# Patient Record
Sex: Male | Born: 1985 | Race: Black or African American | Hispanic: No | Marital: Single | Smoking: Never smoker
Health system: Southern US, Community
[De-identification: ages and names within clinical notes are randomized; demographics above are authoritative.]

## PROBLEM LIST (undated history)

## (undated) ENCOUNTER — Ambulatory Visit (HOSPITAL_COMMUNITY): Payer: Self-pay

## (undated) DIAGNOSIS — F329 Major depressive disorder, single episode, unspecified: Secondary | ICD-10-CM

## (undated) DIAGNOSIS — F32A Depression, unspecified: Secondary | ICD-10-CM

## (undated) DIAGNOSIS — M797 Fibromyalgia: Secondary | ICD-10-CM

## (undated) DIAGNOSIS — B20 Human immunodeficiency virus [HIV] disease: Secondary | ICD-10-CM

## (undated) DIAGNOSIS — G43909 Migraine, unspecified, not intractable, without status migrainosus: Secondary | ICD-10-CM

## (undated) DIAGNOSIS — F419 Anxiety disorder, unspecified: Secondary | ICD-10-CM

## (undated) SURGERY — INCISION AND DRAINAGE, ABSCESS
Anesthesia: General

---

## 2006-10-16 ENCOUNTER — Emergency Department (HOSPITAL_COMMUNITY): Admission: EM | Admit: 2006-10-16 | Discharge: 2006-10-16 | Payer: Self-pay | Admitting: Emergency Medicine

## 2009-01-05 ENCOUNTER — Emergency Department (HOSPITAL_COMMUNITY): Admission: EM | Admit: 2009-01-05 | Discharge: 2009-01-05 | Payer: Self-pay | Admitting: Family Medicine

## 2009-09-23 ENCOUNTER — Emergency Department (HOSPITAL_COMMUNITY): Admission: EM | Admit: 2009-09-23 | Discharge: 2009-09-23 | Payer: Self-pay | Admitting: Emergency Medicine

## 2010-01-30 ENCOUNTER — Encounter: Payer: Self-pay | Admitting: Infectious Disease

## 2010-02-12 ENCOUNTER — Encounter: Payer: Self-pay | Admitting: Adult Health

## 2010-02-12 ENCOUNTER — Ambulatory Visit: Payer: Self-pay | Admitting: Internal Medicine

## 2010-02-12 DIAGNOSIS — F341 Dysthymic disorder: Secondary | ICD-10-CM

## 2010-02-12 DIAGNOSIS — G43909 Migraine, unspecified, not intractable, without status migrainosus: Secondary | ICD-10-CM | POA: Insufficient documentation

## 2010-02-12 DIAGNOSIS — B20 Human immunodeficiency virus [HIV] disease: Secondary | ICD-10-CM | POA: Insufficient documentation

## 2010-02-12 LAB — CONVERTED CEMR LAB
ALT: 58 units/L — ABNORMAL HIGH (ref 0–53)
AST: 31 units/L (ref 0–37)
Albumin: 4.4 g/dL (ref 3.5–5.2)
Alkaline Phosphatase: 85 units/L (ref 39–117)
Basophils Absolute: 0 10*3/uL (ref 0.0–0.1)
Basophils Relative: 1 % (ref 0–1)
Bilirubin Urine: NEGATIVE
Calcium: 9.4 mg/dL (ref 8.4–10.5)
Chlamydia, Swab/Urine, PCR: NEGATIVE
Chloride: 103 meq/L (ref 96–112)
Eosinophils Absolute: 0 10*3/uL (ref 0.0–0.7)
GC Probe Amp, Urine: NEGATIVE
HIV 1 RNA Quant: 168000 copies/mL — ABNORMAL HIGH (ref <20)
HIV-1 RNA Quant, Log: 5.23 — ABNORMAL HIGH (ref <1.30)
HIV-1 antibody: POSITIVE — AB
HIV-2 Ab: NEGATIVE
HIV: REACTIVE
Hemoglobin, Urine: NEGATIVE
Hep B S Ab: NEGATIVE
Ketones, ur: NEGATIVE mg/dL
MCHC: 34.3 g/dL (ref 30.0–36.0)
MCV: 89.6 fL (ref 78.0–100.0)
Monocytes Relative: 16 % — ABNORMAL HIGH (ref 3–12)
Neutro Abs: 1.2 10*3/uL — ABNORMAL LOW (ref 1.7–7.7)
Neutrophils Relative %: 28 % — ABNORMAL LOW (ref 43–77)
Nitrite: NEGATIVE
Platelets: 306 10*3/uL (ref 150–400)
Potassium: 4.2 meq/L (ref 3.5–5.3)
RBC: 5.3 M/uL (ref 4.22–5.81)
RDW: 13 % (ref 11.5–15.5)
Sodium: 138 meq/L (ref 135–145)
Specific Gravity, Urine: 1.02 (ref 1.005–1.030)
Urobilinogen, UA: 0.2 (ref 0.0–1.0)
pH: 6.5 (ref 5.0–8.0)

## 2010-02-13 ENCOUNTER — Encounter: Payer: Self-pay | Admitting: Internal Medicine

## 2010-02-28 ENCOUNTER — Telehealth: Payer: Self-pay | Admitting: Infectious Diseases

## 2010-03-04 ENCOUNTER — Ambulatory Visit: Payer: Self-pay | Admitting: Internal Medicine

## 2010-03-11 ENCOUNTER — Encounter: Payer: Self-pay | Admitting: Infectious Disease

## 2010-03-20 ENCOUNTER — Encounter: Payer: Self-pay | Admitting: Infectious Disease

## 2010-03-26 ENCOUNTER — Telehealth (INDEPENDENT_AMBULATORY_CARE_PROVIDER_SITE_OTHER): Payer: Self-pay | Admitting: *Deleted

## 2010-03-27 ENCOUNTER — Telehealth: Payer: Self-pay | Admitting: Internal Medicine

## 2010-03-27 ENCOUNTER — Encounter (INDEPENDENT_AMBULATORY_CARE_PROVIDER_SITE_OTHER): Payer: Self-pay | Admitting: *Deleted

## 2010-03-28 ENCOUNTER — Encounter (INDEPENDENT_AMBULATORY_CARE_PROVIDER_SITE_OTHER): Payer: Self-pay | Admitting: *Deleted

## 2010-04-16 ENCOUNTER — Ambulatory Visit: Payer: Self-pay | Admitting: Adult Health

## 2010-04-25 ENCOUNTER — Telehealth: Payer: Self-pay | Admitting: Adult Health

## 2010-04-29 ENCOUNTER — Ambulatory Visit: Admit: 2010-04-29 | Payer: Self-pay | Admitting: Adult Health

## 2010-04-30 ENCOUNTER — Encounter (INDEPENDENT_AMBULATORY_CARE_PROVIDER_SITE_OTHER): Payer: Self-pay | Admitting: *Deleted

## 2010-05-20 ENCOUNTER — Ambulatory Visit
Admission: RE | Admit: 2010-05-20 | Discharge: 2010-05-20 | Payer: Self-pay | Source: Home / Self Care | Attending: Adult Health | Admitting: Adult Health

## 2010-05-20 ENCOUNTER — Encounter: Payer: Self-pay | Admitting: Adult Health

## 2010-05-20 DIAGNOSIS — K006 Disturbances in tooth eruption: Secondary | ICD-10-CM | POA: Insufficient documentation

## 2010-05-20 LAB — CONVERTED CEMR LAB
ALT: 43 units/L (ref 0–53)
AST: 20 units/L (ref 0–37)
Basophils Absolute: 0 10*3/uL (ref 0.0–0.1)
Basophils Relative: 0 % (ref 0–1)
Creatinine, Ser: 1.09 mg/dL (ref 0.40–1.50)
Eosinophils Absolute: 0 10*3/uL (ref 0.0–0.7)
Eosinophils Relative: 0 % (ref 0–5)
HCT: 50.4 % (ref 39.0–52.0)
HIV 1 RNA Quant: 87 copies/mL — ABNORMAL HIGH (ref <20)
MCHC: 33.9 g/dL (ref 30.0–36.0)
MCV: 91.8 fL (ref 78.0–100.0)
Neutrophils Relative %: 27 % — ABNORMAL LOW (ref 43–77)
Platelets: 307 10*3/uL (ref 150–400)
RDW: 13.4 % (ref 11.5–15.5)
Sodium: 140 meq/L (ref 135–145)
Total Bilirubin: 0.4 mg/dL (ref 0.3–1.2)
Total Protein: 8.2 g/dL (ref 6.0–8.3)

## 2010-05-21 ENCOUNTER — Other Ambulatory Visit: Payer: Self-pay | Admitting: Infectious Diseases

## 2010-05-21 LAB — T-HELPER CELL (CD4) - (RCID CLINIC ONLY)
CD4 % Helper T Cell: 18 % — ABNORMAL LOW (ref 33–55)
CD4 T Cell Abs: 480 uL (ref 400–2700)

## 2010-05-22 NOTE — Miscellaneous (Signed)
Summary: RW Financial Update  Clinical Lists Changes  Observations: Added new observation of RWPARTICIP: Yes (03/27/2010 9:14) Added new observation of PAYOR: No Insurance (03/27/2010 9:14) Added new observation of RWTITLE: B (03/27/2010 9:14) Added new observation of AIDSDAP: applying (03/27/2010 9:14) Added new observation of PCTFPL: 119.08  (03/27/2010 9:14) Added new observation of INCOMESOURCE: unemployment ins  (03/27/2010 9:14) Added new observation of HOUSEINCOME: [address]  (03/27/2010 9:14) Added new observation of #CHILD<18 IN: No  (03/27/2010 9:14) Added new observation of FAMILYSIZE: 1  (03/27/2010 9:14) Added new observation of HOUSING: Stable/permanent  (03/27/2010 9:14) Added new observation of FINASSESSDT: 03/26/2010  (03/27/2010 9:14) Added new observation of YEARLYEXPEN: 0  (03/27/2010 9:14)  Appended Document: RW Financial Update Patient applied for ADAP - faxed app on 03/26/10

## 2010-05-22 NOTE — Miscellaneous (Signed)
Summary: ADAP Update  Clinical Lists Changes  Observations: Added new observation of AIDSDAP: Yes 2011 (03/28/2010 11:44)

## 2010-05-22 NOTE — Progress Notes (Signed)
Summary: health dept. calling  Phone Note Other Incoming   Caller: [facility] health Dept. Summary of Call: Shon Hough from [facility]. called requesting that pt. have RPR checked at appt. 03/04/10.  Pts. last RPR done during the time pt. listed as contact  Initial call taken by: Wendall Mola CMA Duncan Dull),  February 28, 2010 2:43 PM

## 2010-05-22 NOTE — Miscellaneous (Signed)
Summary: HIPAA Restrictions  HIPAA Restrictions   Imported By: Florinda Marker 02/14/2010 14:14:01  _____________________________________________________________________  External Attachment:    Type:   Image     Comment:   External Document

## 2010-05-22 NOTE — Progress Notes (Signed)
Summary: Atripla sample given  Phone Note Call from Patient   Caller: Patient Summary of Call: Pt. came in for ADAP certification, per Britta Mccreedy it should take no more than 2 weeks.  Per Dr. Philipp Deputy pt. given samples of Atripla take one tablet one time a day #30.  Lot # [id number], Exp. 11/08/11.  Pt. advised of instructions and side effects. Initial call taken by: Wendall Mola CMA Duncan Dull),  March 26, 2010 2:36 PM

## 2010-05-22 NOTE — Consult Note (Signed)
Summary: New Pt. Referral:  New Pt. Referral:   Imported By: Florinda Marker 03/20/2010 11:16:05  _____________________________________________________________________  External Attachment:    Type:   Image     Comment:   External Document

## 2010-05-22 NOTE — Assessment & Plan Note (Signed)
Summary: Nurse Visit (Infectious Disease)   Infectious Disease New Patient Intake Referring MD/Agency: [facility] Address: [address] [place], [place] [address]  Employer: [employer]   Does insurance cover prescriptions? No Our patient has been informed that medication assistance programs are available.  Our Co-ordinator will be meeting with the patient during this visit to discuss financial and medication assistance.   Do you have a Primary physician: Yes Are family members aware of patient's diagnosis?  If so, are they supportive? None Describe patient's current social support (family, friends, support groups): Friends  Medical History Medication Allergies: No   Medical Hx Comments: Pt is currently out on Medical leave due to increase stress/anxiety from his employer.  He is under the care of a Psychiatrist  Dr Dionne Milo .   Recent complaints of bilateral knee pain X 2 months .   Family History Hypertension: Yes Cancer: Yes  Family Side: Maternal, Paternal  Comments: colon  Tobacco Use: never  Behavioral Health Assessment Have you ever been diagnosed with depression or mental illness? Yes  Diagnosis: Deprsssion/Anxiety due to on the job stress Do you drink alcohol? Yes Frequency: social Alcohol Beverage Type(s): wine Do you use recreational drugs? No Do you feel you have a problem with drugs and/or alcohol? No   If you are currently using drugs and/or alcohol, would you like help for this addiction? No  HIV Intake Information When did you first test positive for HIV? 12/13/2009 Type of test Conducted: Western Blot   Where was this test performed?  Name of Agency: [facility]  Risk Factor(s) for HIV: MSM  Method of Exposure to HIV: Homosexual Intercourse-Receptive Have you ever been hospitalized for any HIV-related condition? No  Have you ever been under the care of a physician for being HIV positive? No  Newly Diagnosed Patients Has a Disease Intervention Specialist  from the Health Department contacted the patient? Yes.   The patient has been informed that the [facility] Department will contact ALL newly reported cases. Health Department Contact:  [phone]   (SSN is needed for confirmation)  Reported Today: 02/20/2010 Health Department Contact:  [phone]            (SSN is needed for confirmation)  Person Reporting: Prev. reproted /GHD Do you have any Non-HIV related medical conditions or other prior hospitalizations or surgeries? No  HIV Medications Information The patient is currently NOT taking any HIV medications.  Infection History  Patient has been diagnosed with the following opportunistic infections: Are there any other symptoms you need to discuss? No Have you received literature/education prior to this visit about HIV/AIDS? No Do you understand the meaning of a Viral Load? No Do you understand the meaning of a CD4 count? No Lab Values Education/Handout Given Yes Medication Education/Handout Given Yes  Sexual History Are you in a current relationship? No If no, when was your last encounter? 8/11 Was this protected intercourse? No When was your last unprotected sex? 8/11 Safe Sex Counseling/Pamphlet Given Sexual History Comments: Last 1 months 4 male partners (unprotected)  Last year 69 male partners(unprotected)  Lifetime 29 male partners(unprotected) to include 2 females   Evaluation and Follow-Up INTAKE CHECK LIST: HIV Education, Safe Sex Counseling, HIV Material Given  Prevention For Positives: 02/12/2010   Safe sex practices discussed with patient. Condoms offered. Are you in need of condoms at this time? No Our patient has been informed that condoms are always available in this clinic.   Brochure Provided for Above Organizations? Yes  Name of Agency: [facility] Referral made to Social Worker, Dorothe Pea, LCSW-P: No  Immunizations Administered:  Influenza Vaccine # 1:    Vaccine Type: Fluvax Non-MCR    Site: right  deltoid    Mfr: fluvirin    Dose: 0.5 ml    Route: IM    Given by: Tomasita Morrow RN    Exp. Date: 08/17/2010    Lot #: [id number]    VIS given: 11/13/09 version given February 12, 2010.  PPD Skin Test:    Vaccine Type: PPD    Site: left forearm    Mfr: Sanofi Pasteur    Dose: 0.1 ml    Route: ID    Given by: Tomasita Morrow RN    Exp. Date: 10/26/2011    Lot #: [id number]  Pneumonia Vaccine:    Vaccine Type: Pneumovax    Site: left deltoid    Mfr: Merck    Dose: 0.5 ml    Route: IM    Given by: Tomasita Morrow RN    Exp. Date: 08/01/2011    Lot #: [id number]    VIS given: 03/26/09 version given February 12, 2010.  Flu Vaccine Consent Questions:    Do you have a history of severe allergic reactions to this vaccine? no    Any prior history of allergic reactions to egg and/or gelatin? no    Do you have a sensitivity to the preservative Thimersol? no    Do you have a past history of Guillan-Barre Syndrome? no    Do you currently have an acute febrile illness? no    Have you ever had a severe reaction to latex? no    Vaccine information given and explained to patient? yes             Prevention For Positives: 02/12/2010   Safe sex practices discussed with patient. Condoms offered.

## 2010-05-22 NOTE — Consult Note (Signed)
Summary: New Pt. Referral: [facility]  New Pt. Referral: [facility]   Imported By: Florinda Marker 03/06/2010 16:12:19  _____________________________________________________________________  External Attachment:    Type:   Image     Comment:   External Document
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
## 2010-05-22 NOTE — Miscellaneous (Signed)
Summary: [employer] Disability: Appeals Specialist  [employer] Disability: Appeals Specialist   Imported By: Florinda Marker 03/20/2010 11:17:19  _____________________________________________________________________  External Attachment:    Type:   Image     Comment:   External Document

## 2010-05-22 NOTE — Progress Notes (Signed)
Summary: [employer] Physician  Phone Note Other Incoming   Caller: Dr. Warren Danes from [facility] Summary of Call: Dr. Warren Danes from [facility] called regarding a disability claim for pt.  Pt. has signed release of info.  I spoke with Dr. Warren Danes and per Dr. Philipp Deputy pt. is not disabled from an HIV  standpoint, he does have migraines, but until he receives discount card we are unable to order MRI or refer to Neurologist.  Dr. Warren Danes also spoke to PCP Dr. Derrek Gu and was told he was not disabled from a medical standpoint.  Possibly mental health and I let him know he has an appt. with therapist Alysa for next week. Initial call taken by: Wendall Mola CMA Duncan Dull),  March 27, 2010 4:31 PM

## 2010-05-23 NOTE — Progress Notes (Signed)
Summary: pt. requesting paperwork   Phone Note Call from Patient Call back at Home Phone [phone]   Caller: Patient Call For: Starleen Arms, CMA Reason for Call: Talk to Nurse Summary of Call: Pt. lrft message re: paperwork that he had requested.  Would appreciate a call back.  Jennet Maduro RN  April 25, 2010 11:25 AM

## 2010-05-23 NOTE — Assessment & Plan Note (Signed)
Summary: per tammy meds? [mkj]    Primary Samiyyah Moffa:  Talmadge Chad NP  CC:  haveing side effects from Atripla, diziness, vivid dreams, and "feeling loopy".  History of Present Illness: Presents to clinic with c/o intolerance to Atripla with severe vivid dreams and night terrors.  Claims symptoms were unrelenting so voluntarily stopped regimen.  Has not had symtoms since stopping regimen.  Hx of migraine headaches and depression.  No episodes of headaches or anxiety currently.  Has not taken meds for depression for "some time" secondary to side effects of Seroquel.  States meds made him "sleepy" and lack energy.  Still has some depressive episodes, but denies SI or anxiety.  Missed appointment with mental health counselor and has not made another appointment.   Preventive Screening-Counseling & Management  Alcohol-Tobacco     Alcohol drinks/day: social     Smoking Status: never   Current Allergies (reviewed today): ! * SEAFOOD Review of Systems  The patient denies anorexia, fever, weight loss, weight gain, vision loss, decreased hearing, hoarseness, chest pain, syncope, dyspnea on exertion, peripheral edema, prolonged cough, headaches, hemoptysis, abdominal pain, melena, hematochezia, severe indigestion/heartburn, hematuria, incontinence, genital sores, muscle weakness, suspicious skin lesions, transient blindness, difficulty walking, depression, unusual weight change, abnormal bleeding, enlarged lymph nodes, angioedema, breast masses, and testicular masses.    Vital Signs:  Patient profile:   25 year old male Height:      65 inches (165.10 cm) Weight:      164.50 pounds (74.77 kg) BMI:     26.39 Temp:     97.9 degrees F (36.61 degrees C) oral Pulse rate:   69 / minute BP sitting:   130 / 84  (left arm)  Vitals Entered By: Starleen Arms CMA (April 16, 2010 11:07 AM) CC: haveing side effects from Atripla, diziness, vivid dreams, "feeling loopy" Is Patient Diabetic?  No Pain Assessment Patient in pain? no      Nutritional Status BMI of 25 - 29 = overweight  Does patient need assistance? Functional Status Self care Ambulation Normal   Physical Exam  General:  alert, well-developed, well-nourished, and well-hydrated.   Head:  Normocephalic and atraumatic without obvious abnormalities. No apparent alopecia or balding. Eyes:  No corneal or conjunctival inflammation noted. EOMI. Perrla. Funduscopic exam benign, without hemorrhages, exudates or papilledema. Vision grossly normal. Ears:  External ear exam shows no significant lesions or deformities.  Otoscopic examination reveals clear canals, tympanic membranes are intact bilaterally without bulging, retraction, inflammation or discharge. Hearing is grossly normal bilaterally. Nose:  External nasal examination shows no deformity or inflammation. Nasal mucosa are pink and moist without lesions or exudates. Mouth:  Oral mucosa and oropharynx without lesions or exudates.  Teeth in good repair. Neck:  No deformities, masses, or tenderness noted. Chest Wall:  No deformities, masses, tenderness or gynecomastia noted. Lungs:  Normal respiratory effort, chest expands symmetrically. Lungs are clear to auscultation, no crackles or wheezes. Heart:  Normal rate and regular rhythm. S1 and S2 normal without gallop, murmur, click, rub or other extra sounds. Abdomen:  Bowel sounds positive,abdomen soft and non-tender without masses, organomegaly or hernias noted. Rectal:  Deferred this visit Genitalia:  Deferred this visit Prostate:  Deferred Msk:  No deformity or scoliosis noted of thoracic or lumbar spine.   Pulses:  R and L carotid,radial,femoral,dorsalis pedis and posterior tibial pulses are full and equal bilaterally Extremities:  No clubbing, cyanosis, edema, or deformity noted with normal full range of motion of all joints.  Neurologic:  No cranial nerve deficits noted. Station and gait are normal. Sensory,  motor and coordinative functions appear intact. Skin:  Intact without suspicious lesions or rashes Cervical Nodes:  Shoddy A&P LAD Axillary Nodes:  Shoddy bil LAD Psych:  Oriented X3, memory intact for recent and remote, good eye contact, not anxious appearing, not agitated, flat affect, and subdued.     Impression & Recommendations:  Problem # 1:  HIV INFECTION (ICD-042) Intolerance to effects of Atripla.  Discussed in detail alternate ARV options including SE's, ADR's, and potential toxicities.  After extensive discussion (> 20 min), we chose Truvada and Isentress as his new regimen.  Discussed pathology of HIV, goals of treatment, and benefits as well as risks of treatment, importance of adherence.  Asked appropriate questions, acknowledged information provided and agreed with plan of care.  Will begin regimen after April 22, 2010, with scheduled lab draw in 4 weeks from start date, and f/u in 6 weeks.  Instructed to contact clinic if having any problems with new regimen.  Instructed to bring questions he may have regarding mediucations and his care on his next f/u.  Hep B #1 and Hep A #1 to be given. Orders: Est. Patient Level IV (99214)Future Orders: T-CBC w/Diff ([id number]) ... 05/20/2010 T-CD4SP ([facility]) (CD4SP) ... 05/20/2010 T-Comprehensive Metabolic Panel [id number]) ... 05/20/2010 T-HIV Viral Load [id number]) ... 05/20/2010  Problem # 2:  ANXIETY DEPRESSION (ICD-300.4) Instructed to schedule and keep appointment with mental health counselor.  Once she has evaluated current state, we will discuss possible treatment options for him.  He acknowledged this and agreed with plan of care.  Medications Added to Medication List This Visit: 1)  Isentress 400 Mg Tabs (Raltegravir potassium) .... Take one (1) tablet every twelve (12) hours 2)  Truvada 200-300 Mg Tabs (Emtricitabine-tenofovir) .... Take one (1) tablet once a day  Other Orders: Hepatitis A Vaccine (Adult Dose)  (41324) Admin 1st Vaccine (40102) Hepatitis B Vaccine >43yrs (72536) Admin of Any Addtl Vaccine (64403)   Immunizations Administered:  Hepatitis A Vaccine # 1:    Vaccine Type: HepA    Site: left deltoid    Mfr: GlaxoSmithKline    Dose: 1.0 ml    Route: IM    Given by: Starleen Arms CMA    Exp. Date: 05/24/2012    Lot #: [id number]    VIS given: 07/09/04 version given April 16, 2010.  Hepatitis B Vaccine # 1:    Vaccine Type: HepB Adult    Site: left deltoid    Dose: 1.0 ml    Route: IM    Given by: Starleen Arms CMA    Exp. Date: 04/08/2012    Lot #: [id number]    VIS given: 11/05/05 version given April 16, 2010.

## 2010-05-23 NOTE — Miscellaneous (Signed)
  Clinical Lists Changes 

## 2010-05-24 ENCOUNTER — Encounter (HOSPITAL_COMMUNITY): Payer: Self-pay

## 2010-05-24 ENCOUNTER — Ambulatory Visit (HOSPITAL_COMMUNITY)
Admission: RE | Admit: 2010-05-24 | Discharge: 2010-05-24 | Disposition: A | Discharge status: Home / Self Care | Payer: Self-pay | Source: Ambulatory Visit | Attending: Infectious Diseases | Admitting: Infectious Diseases

## 2010-05-24 DIAGNOSIS — R5381 Other malaise: Secondary | ICD-10-CM | POA: Insufficient documentation

## 2010-05-24 DIAGNOSIS — R112 Nausea with vomiting, unspecified: Secondary | ICD-10-CM | POA: Insufficient documentation

## 2010-05-24 DIAGNOSIS — R51 Headache: Secondary | ICD-10-CM | POA: Insufficient documentation

## 2010-05-24 MED ORDER — IOHEXOL 300 MG/ML  SOLN: 100.0000 mL | Freq: Once | INTRAMUSCULAR | Status: AC | PRN

## 2010-05-28 ENCOUNTER — Telehealth: Payer: Self-pay | Admitting: Adult Health

## 2010-05-30 ENCOUNTER — Ambulatory Visit: Payer: Self-pay | Admitting: Adult Health

## 2010-05-30 ENCOUNTER — Telehealth (INDEPENDENT_AMBULATORY_CARE_PROVIDER_SITE_OTHER): Payer: Self-pay | Admitting: *Deleted

## 2010-05-31 ENCOUNTER — Encounter (INDEPENDENT_AMBULATORY_CARE_PROVIDER_SITE_OTHER): Payer: Self-pay | Admitting: *Deleted

## 2010-06-03 ENCOUNTER — Ambulatory Visit: Payer: Self-pay | Admitting: Adult Health

## 2010-06-05 ENCOUNTER — Encounter (INDEPENDENT_AMBULATORY_CARE_PROVIDER_SITE_OTHER): Payer: Self-pay | Admitting: *Deleted

## 2010-06-06 NOTE — Progress Notes (Signed)
Summary: CT results  Phone Note Call from Patient   Caller: Patient Summary of Call: Pt. called for CT results, pt. notified of normal CT Initial call taken by: Wendall Mola CMA Duncan Dull),  May 28, 2010 12:17 PM

## 2010-06-06 NOTE — Miscellaneous (Signed)
  Clinical Lists Changes 

## 2010-06-06 NOTE — Assessment & Plan Note (Signed)
Summary: TOOTH PAIN/VS   Primary Provider:  Talmadge Chad NP  CC:  pt in with toothache, pt needs to talk abt his disability information, and Depression.  History of Present Illness: In clinic today for labs, but c/o impacted wisdom tooth pain.  Scheduled to be evaluated by dentist, but pain is "distracting."  Also endorses a long-standing hx of "crippling" migraine headaches that keep him "in bed" for full days.  Denies any precipitous aura or focal point of headache.  Relates only a diffuse "pressure-like" sensation.  Denies any neurologic w/u except for head CT in 2011.  Also chronic hx of depression which he says has kept him from working (in addition to "migraines").  Has not a recent psychiatric evaluation but claims any medication he takes for depression has caused intolerance.  States he still feels depressed, but denies suicidal/homicidal ideations.  Also states HIV medications causing him vivid dreams and anxiety.  He has been off Atripla >1 month.  Given all these symptoms, he is requesting we determine him disabled.  The patient denies that he feels like life is not worth living, denies that he wishes that he were dead, and denies that he has thought about ending his life.        Comments:  pt says he is aggrevated but fine.   Preventive Screening-Counseling & Management  Alcohol-Tobacco     Alcohol drinks/day: social     Smoking Status: never  Caffeine-Diet-Exercise     Caffeine use/day: yes     Does Patient Exercise: no  Hep-HIV-STD-Contraception     HIV Risk: no risk noted     HIV Risk Counseling: not indicated-no HIV risk noted  Safety-Violence-Falls     Seat Belt Use: yes      Sexual History:  almost in a relationship.        Drug Use:  never.    Comments: pt accepted condoms   Current Allergies: ! * SEAFOOD Review of Systems General:  Complains of loss of appetite, malaise, and sleep disorder; denies chills, fatigue, fever, sweats, weakness, and weight  loss. Eyes:  Complains of light sensitivity; denies blurring, discharge, double vision, eye irritation, eye pain, halos, itching, red eye, vision loss-1 eye, and vision loss-both eyes. ENT:  Denies decreased hearing, difficulty swallowing, ear discharge, earache, hoarseness, nasal congestion, nosebleeds, postnasal drainage, ringing in ears, sinus pressure, and sore throat. CV:  Denies bluish discoloration of lips or nails, chest pain or discomfort, difficulty breathing at night, difficulty breathing while lying down, fainting, fatigue, leg cramps with exertion, lightheadness, near fainting, palpitations, shortness of breath with exertion, swelling of feet, swelling of hands, and weight gain. Resp:  Denies chest discomfort, chest pain with inspiration, cough, coughing up blood, excessive snoring, hypersomnolence, morning headaches, pleuritic, shortness of breath, sputum productive, and wheezing. GI:  Complains of loss of appetite and nausea; denies abdominal pain, bloody stools, change in bowel habits, constipation, dark tarry stools, diarrhea, excessive appetite, gas, hemorrhoids, indigestion, vomiting, vomiting blood, and yellowish skin color. MS:  Denies joint pain, joint redness, joint swelling, loss of strength, low back pain, mid back pain, muscle aches, muscle, cramps, muscle weakness, stiffness, and thoracic pain. Derm:  Denies changes in color of skin, changes in nail beds, dryness, excessive perspiration, flushing, hair loss, insect bite(s), itching, lesion(s), poor wound healing, and rash. Neuro:  Complains of difficulty with concentration, disturbances in coordination, falling down, and headaches; denies brief paralysis, inability to speak, memory loss, numbness, poor balance, seizures, sensation of room spinning,  tingling, tremors, visual disturbances, and weakness. Psych:  Complains of anxiety, depression, easily angered, easily tearful, irritability, and mental problems; denies panic attacks,  sense of great danger, suicidal thoughts/plans, thoughts of violence, unusual visions or sounds, and thoughts /plans of harming others. Endo:  Denies cold intolerance, excessive hunger, excessive thirst, excessive urination, heat intolerance, polyuria, and weight change. Heme:  Denies abnormal bruising, bleeding, enlarge lymph nodes, fevers, pallor, and skin discoloration.  Vital Signs:  Patient profile:   25 year old male Height:      65 inches Weight:      158 pounds BMI:     26.39 Temp:     98 degrees F oral Pulse rate:   73 / minute BP sitting:   133 / 85  (right arm)  Vitals Entered By: Alesia Morin CMA (May 20, 2010 2:27 PM) CC: pt in with toothache, pt needs to talk abt his disability information, Depression Is Patient Diabetic? No Pain Assessment Patient in pain? no      Nutritional Status BMI of 25 - 29 = overweight Nutritional Status Detail appetite "not to good"  Have you ever been in a relationship where you felt threatened, hurt or afraid?No   Does patient need assistance? Functional Status Self care Ambulation Normal Comments no missed doses   Physical Exam  General:  alert, well-developed, well-nourished, and well-hydrated.   Head:  normocephalic, atraumatic, no abnormalities observed, and no abnormalities palpated.   Eyes:  vision grossly intact, pupils equal, pupils round, and pupils reactive to light.   Ears:  External ear exam shows no significant lesions or deformities.  Otoscopic examination reveals clear canals, tympanic membranes are intact bilaterally without bulging, retraction, inflammation or discharge. Hearing is grossly normal bilaterally. Mouth:  (+) impacted molars in (L) mandibular area. Neck:  supple and full ROM.   Lungs:  normal respiratory effort and normal breath sounds.   Heart:  normal rate and regular rhythm.   Abdomen:  soft, non-tender, normal bowel sounds, no hepatomegaly, and no splenomegaly.   Msk:  normal ROM, no joint  tenderness, no joint swelling, no joint warmth, and no redness over joints.   Pulses:  Peripheral pulses 2+ symmetrically equal bilaterally. Extremities:  No clubbing, cyanosis, edema, or deformity noted with normal full range of motion of all joints.   Neurologic:  alert & oriented X3, cranial nerves II-XII intact, strength normal in all extremities, gait normal, finger-to-nose normal, heel-to-shin normal, toes down bilaterally on Babinski, and Romberg negative.   Skin:  Intact without suspicious lesions or rashes Psych:  Oriented X3, memory intact for recent and remote, normally interactive, good eye contact, not anxious appearing, not depressed appearing, and not agitated.     Impression & Recommendations:  Problem # 1:  HIV INFECTION (ICD-042) Labs today with f/u in 2 weeks.  I explained to him his current regimen does not have the intolerance issues he raises.  Recommend no TV or news prior to bed.  Encouraged to watch something light, humorous before bed and avoid ETOH or recreational drugs as these may impact sleep pattern and dream activity.  Problem # 2:  WISDOM TEETH IMPACTION (ICD-520.6) Will pend dental services referral.  Meanwhile, will give one-time course of Vicodin 1 by mouth q6h as needed pain (30 dispensed) Orders: Dental Referral (Dentist)  Problem # 3:  MIGRAINE HEADACHE (ICD-346.90) Needs head CT before this is further evaluated.  Given his limited funding source, we will need to secure some source of payment.  He  was referred to [facility] for case management to assist with this process.   Also needs referral to neurologist for migraines if head CT shows no abnormality. His updated medication list for this problem includes:    Hydrocodone-acetaminophen 5-500 Mg Tabs (Hydrocodone-acetaminophen) .Marland Kitchen... 1 tablet by mouth every 6 hours as needed for pain.  Orders: CT with Contrast (CT w/ contrast)  Problem # 4:  ANXIETY DEPRESSION (ICD-300.4) If he has a long hx of treatment  intolerance, psychiatry referral is warranted to ascertain what, if any, medications might assist him.  Additionally, he was informed given his complaints, it would be both a psychiatrist and neurologist who would need to ascertain his disability status as from a HIV perspective, he is not disabled. Orders: Psychiatric Referral (Psych)  Medications Added to Medication List This Visit: 1)  Hydrocodone-acetaminophen 5-500 Mg Tabs (Hydrocodone-acetaminophen) .Marland Kitchen.. 1 tablet by mouth every 6 hours as needed for pain. Prescriptions: HYDROCODONE-ACETAMINOPHEN 5-500 MG TABS (HYDROCODONE-ACETAMINOPHEN) 1 tablet by mouth every 6 hours as needed for pain.  #30 x 0   Entered and Authorized by:   Talmadge Chad NP   Signed by:   Talmadge Chad NP on 05/20/2010   Method used:   Print then Give to Patient   RxID:   [id number]

## 2010-06-06 NOTE — Progress Notes (Signed)
  Phone Note Outgoing Call   Call placed by: Alesia Morin CMA,  May 30, 2010 2:18 PM Call placed to: Patient Summary of Call: Informed patient that he would have to call [facility] to schedule his appointment. Gave him the number [phone] and encouraged him to make appt asap.

## 2010-06-08 ENCOUNTER — Encounter (INDEPENDENT_AMBULATORY_CARE_PROVIDER_SITE_OTHER): Payer: Self-pay | Admitting: *Deleted

## 2010-06-12 NOTE — Miscellaneous (Signed)
Summary: Rw update  Clinical Lists Changes

## 2010-06-12 NOTE — Miscellaneous (Signed)
  Clinical Lists Changes 

## 2010-07-05 ENCOUNTER — Ambulatory Visit: Payer: Self-pay | Admitting: Adult Health

## 2010-07-05 ENCOUNTER — Encounter: Payer: Self-pay | Admitting: Adult Health

## 2010-07-05 ENCOUNTER — Encounter (INDEPENDENT_AMBULATORY_CARE_PROVIDER_SITE_OTHER): Payer: Self-pay | Admitting: *Deleted

## 2010-07-08 LAB — COMPREHENSIVE METABOLIC PANEL
Albumin: 3.5 g/dL (ref 3.5–5.2)
BUN: 10 mg/dL (ref 6–23)
Calcium: 8.1 mg/dL — ABNORMAL LOW (ref 8.4–10.5)
Chloride: 104 mEq/L (ref 96–112)
Creatinine, Ser: 1.35 mg/dL (ref 0.4–1.5)
GFR calc non Af Amer: >60 mL/min (ref >60)
Total Bilirubin: 0.7 mg/dL (ref 0.3–1.2)

## 2010-07-08 LAB — CBC
HCT: 46.1 % (ref 39.0–52.0)
MCHC: 33.9 g/dL (ref 30.0–36.0)
MCV: 92.6 fL (ref 78.0–100.0)
Platelets: 150 10*3/uL (ref 150–400)
RDW: 13.2 % (ref 11.5–15.5)
WBC: 5.1 10*3/uL (ref 4.0–10.5)

## 2010-07-08 LAB — DIFFERENTIAL
Basophils Absolute: 0 10*3/uL (ref 0.0–0.1)
Lymphocytes Relative: 22 % (ref 12–46)
Monocytes Absolute: 0.5 10*3/uL (ref 0.1–1.0)
Neutro Abs: 3.5 10*3/uL (ref 1.7–7.7)

## 2010-07-09 NOTE — Miscellaneous (Signed)
Summary: Prescription Assistance  Clinical Lists Changes  Mr. Eduardo Leon completed the application for [employer] for Cymbalta and it was faxed today.  The company said that it can take up to 4 weeks to process.

## 2010-07-18 NOTE — Assessment & Plan Note (Signed)
Summary: f/u ov/vs   Vital Signs:  Patient profile:   25 year old male Height:      65 inches (165.10 cm) Weight:      156.50 pounds (71.14 kg) BMI:     26.14 Temp:     97.6 degrees F (36.44 degrees C) oral Pulse rate:   64 / minute BP sitting:   123 / 75  (left arm)  Vitals Entered By: Starleen Arms CMA (July 05, 2010 10:21 AM) CC: f/u Is Patient Diabetic? No Pain Assessment Patient in pain? yes     Location: all over Intensity: 3 Type: aching Nutritional Status BMI of 25 - 29 = overweight  Does patient need assistance? Functional Status Self care Ambulation Normal   CC:  f/u.  Allergies: 1)  ! * Seafood   Medications Added to Medication List This Visit: 1)  Cymbalta 20 Mg Cpep (Duloxetine hcl) .... Week 1:  1 tablet by mouth once daily x 7 days then  week 2: 1 1/2 tablets by mouth once daily x 7 days 2)  Cymbalta 20 Mg Cpep (Duloxetine hcl) .... Starting week 3: 1 tablet by mouth two times a day. Prescriptions: CYMBALTA 20 MG CPEP (DULOXETINE HCL) WEEK 1:  1 tablet by mouth once daily x 7 days then  WEEK 2: 1 1/2 tablets by mouth once daily x 7 days  #18 x 0   Entered and Authorized by:   Talmadge Chad NP   Signed by:   Talmadge Chad NP on 07/05/2010   Method used:   Print then Give to Patient   RxID:   [id number] CYMBALTA 20 MG CPEP (DULOXETINE HCL) STARTING WEEK 3: 1 tablet by mouth two times a day.  #14 x 0   Entered and Authorized by:   Talmadge Chad NP   Signed by:   Talmadge Chad NP on 07/05/2010   Method used:   Print then Give to Patient   RxID:   [id number]

## 2010-07-19 ENCOUNTER — Ambulatory Visit (INDEPENDENT_AMBULATORY_CARE_PROVIDER_SITE_OTHER): Payer: Self-pay | Admitting: Adult Health

## 2010-07-19 ENCOUNTER — Encounter: Payer: Self-pay | Admitting: Adult Health

## 2010-07-19 DIAGNOSIS — J45909 Unspecified asthma, uncomplicated: Secondary | ICD-10-CM

## 2010-07-19 DIAGNOSIS — F418 Other specified anxiety disorders: Secondary | ICD-10-CM

## 2010-07-19 DIAGNOSIS — J329 Chronic sinusitis, unspecified: Secondary | ICD-10-CM

## 2010-07-19 DIAGNOSIS — M797 Fibromyalgia: Secondary | ICD-10-CM

## 2010-07-19 DIAGNOSIS — IMO0001 Reserved for inherently not codable concepts without codable children: Secondary | ICD-10-CM

## 2010-07-19 DIAGNOSIS — F341 Dysthymic disorder: Secondary | ICD-10-CM

## 2010-07-19 MED ORDER — CETIRIZINE HCL 10 MG PO TABS: 10.0000 mg | ORAL_TABLET | Freq: Every day | ORAL | Status: DC

## 2010-07-19 MED ORDER — METHYLPREDNISOLONE 4 MG PO KIT: PACK | ORAL | Status: AC

## 2010-07-19 MED ORDER — DULOXETINE HCL 60 MG PO CPEP: ORAL_CAPSULE | ORAL | Status: DC

## 2010-07-19 MED ORDER — METHYLPREDNISOLONE SODIUM SUCC 125 MG IJ SOLR: 125.0000 mg | Freq: Once | INTRAMUSCULAR | Status: AC

## 2010-07-19 MED ORDER — CIPROFLOXACIN HCL 500 MG PO TABS: 500.0000 mg | ORAL_TABLET | Freq: Two times a day (BID) | ORAL | Status: AC

## 2010-07-19 NOTE — Progress Notes (Signed)
  Subjective:    Patient ID: Eduardo Leon, male    DOB: 07/24/1985, 25 y.o.   MRN: [id number]  Headache  Associated symptoms include coughing, eye redness, rhinorrhea and sinus pressure. Pertinent negatives include no back pain, ear pain, eye pain, fever, hearing loss, neck pain, photophobia, sore throat or tinnitus.  Sinusitis Associated symptoms include congestion, coughing, headaches, sinus pressure and sneezing. Pertinent negatives include no chills, diaphoresis, ear pain, neck pain, shortness of breath or sore throat.  Sinus congestion, running nose, irritated cough for past 2 weeks.  Did not get Cymbalta filled as he could not afford the medication, but did not inform the clinic of this matter.     Review of Systems  Constitutional: Positive for fatigue. Negative for fever, chills, diaphoresis, activity change, appetite change and unexpected weight change.  HENT: Positive for congestion, rhinorrhea, sneezing, postnasal drip and sinus pressure. Negative for hearing loss, ear pain, nosebleeds, sore throat, facial swelling, drooling, mouth sores, trouble swallowing, neck pain, neck stiffness, dental problem, voice change, tinnitus and ear discharge.   Eyes: Positive for redness and itching. Negative for photophobia, pain, discharge and visual disturbance.  Respiratory: Positive for cough. Negative for apnea, choking, chest tightness, shortness of breath, wheezing and stridor.   Cardiovascular: Negative.   Gastrointestinal: Negative.   Genitourinary: Negative.   Musculoskeletal: Positive for myalgias and arthralgias. Negative for back pain, joint swelling and gait problem.  Skin: Negative.   Neurological: Positive for headaches.  Hematological: Negative.   Psychiatric/Behavioral: Positive for sleep disturbance. Negative for suicidal ideas, hallucinations, behavioral problems, confusion, self-injury, dysphoric mood, decreased concentration and agitation. The patient is nervous/anxious. The  patient is not hyperactive.        Objective:   Physical Exam  Constitutional: He is oriented to person, place, and time. He appears well-developed and well-nourished.  HENT:  Head: Normocephalic and atraumatic.  Right Ear: No drainage or swelling. Tympanic membrane is bulging. Tympanic membrane is not injected. No middle ear effusion.  Left Ear: No drainage or swelling. Tympanic membrane is bulging. Tympanic membrane is not injected.  No middle ear effusion.  Nose: Rhinorrhea present.  Mouth/Throat: Mucous membranes are not pale.       (+) post nasal drip  Eyes: EOM are normal. Pupils are equal, round, and reactive to light. Right eye exhibits no discharge. Left eye exhibits no discharge. No scleral icterus.  Neck: Normal range of motion. Neck supple.  Cardiovascular: Normal rate, regular rhythm, normal heart sounds and intact distal pulses.   Pulmonary/Chest: Effort normal and breath sounds normal.  Abdominal: Soft. Bowel sounds are normal.  Musculoskeletal: Normal range of motion.  Neurological: He is alert and oriented to person, place, and time.  Skin: Skin is warm and dry.  Psychiatric: His affect is blunt. He exhibits a depressed mood.          Assessment & Plan:  Sinusitis with reactive airway disease: Will give Solumedrol 125 mg IM x 1 then Medrol dosepak x 5 days, Cipro 500 mg po bid x 7 days, cetirizine 10 mg po qd.  Depression with anxiety and Fibromyalgia symptoms:  Will give Cymbalta samples 60 mg 1/2 tab po qd until PAP is processed.  Schedule f/u in 2 weeks for eval.  Verbally acknowledged instructions and agreed with plan.

## 2010-07-26 LAB — GC/CHLAMYDIA PROBE AMP, GENITAL: GC Probe Amp, Genital: NEGATIVE

## 2010-08-02 ENCOUNTER — Ambulatory Visit: Payer: Self-pay | Admitting: Adult Health

## 2010-09-10 ENCOUNTER — Ambulatory Visit (INDEPENDENT_AMBULATORY_CARE_PROVIDER_SITE_OTHER): Payer: Self-pay | Admitting: Adult Health

## 2010-09-10 ENCOUNTER — Encounter: Payer: Self-pay | Admitting: Adult Health

## 2010-09-10 DIAGNOSIS — R110 Nausea: Secondary | ICD-10-CM

## 2010-09-10 DIAGNOSIS — Z21 Asymptomatic human immunodeficiency virus [HIV] infection status: Secondary | ICD-10-CM

## 2010-09-10 DIAGNOSIS — B20 Human immunodeficiency virus [HIV] disease: Secondary | ICD-10-CM

## 2010-09-10 MED ORDER — ONDANSETRON HCL 4 MG PO TABS: ORAL_TABLET | ORAL | Status: DC

## 2010-09-10 NOTE — Progress Notes (Signed)
Lab staff reported patient addressed issue about possible exposure to syphilis during the time he was having. His blood drawn. Orders for an RPR, as well as urine for GC and Chlamydia gene probe were given.

## 2010-09-10 NOTE — Progress Notes (Signed)
  Subjective:    Patient ID: Eduardo Leon, male    DOB: December 04, 1985, 25 y.o.   MRN: [id number]  HPI Presents to clinic for followup. No labs since 05/20/2010. Did not kitchen repeat labs as requested prior to this visit. States he was out of town and could not keep his appointment, but that was over a month ago. Stopped taking his Cymbalta therapy because it causes nausea. States he left messages regarding this, but no response (A. review of records indicate. No phone calls documented since last visit in March 2012). States medication made him nauseated. Was taking it on an empty stomach. States he remains adherent to his HIV medication with good tolerance.   Review of Systems  Constitutional: Positive for fatigue.  Eyes: Negative.   Respiratory: Negative.   Cardiovascular: Negative.   Gastrointestinal:       Nausea with taking Cymbalta, otherwise, unremarkable  Genitourinary: Negative.   Musculoskeletal: Positive for myalgias, back pain and arthralgias.  Neurological: Positive for headaches.  Hematological: Negative.   Psychiatric/Behavioral:       Some depressive mood reported, but no suicidal ideation       Objective:   Physical Exam  Constitutional: He is oriented to person, place, and time. He appears well-developed and well-nourished. No distress.  HENT:  Head: Normocephalic and atraumatic.  Eyes: Conjunctivae and EOM are normal. Pupils are equal, round, and reactive to light.  Neck: Normal range of motion. Neck supple.  Cardiovascular: Normal rate and regular rhythm.   Pulmonary/Chest: Effort normal and breath sounds normal.  Abdominal: Soft. Bowel sounds are normal.  Musculoskeletal: Normal range of motion.  Neurological: He is alert and oriented to person, place, and time.  Skin: Skin is warm and dry.  Psychiatric: His behavior is normal. Judgment and thought content normal.       Affect markedly blunted          Assessment & Plan:  1. HIV. Staging labs, since  January 2012. We'll repeat CBC, CD4, CMP, an HIV RNA today. Continue his present management, and have her return in 2 weeks for followup. He was informed that completion of his disability paperwork cannot be completed until his labs are available. Verbally acknowledged and agreed with plan.  2. Medication Intolerance. We'll try ondansetron 4 mg by mouth 30 minutes before his Cymbalta to see if this helps. His nausea. Recommended taking Cymbalta with food.  3. Fibromyalgia. Cannot really evaluate response until we see whether the Cymbalta is working. We will followup when he returns for his lab review in 2 weeks.  Verbally acknowledged information and agreed with plan.

## 2010-09-10 NOTE — Progress Notes (Signed)
Addended by: Domenick Gong on: 09/10/2010 10:13 AM   Modules accepted: Orders

## 2010-09-11 LAB — COMPLETE METABOLIC PANEL WITH GFR
ALT: 24 U/L (ref 0–53)
CO2: 24 mEq/L (ref 19–32)
Calcium: 9.5 mg/dL (ref 8.4–10.5)
Chloride: 103 mEq/L (ref 96–112)
GFR, Est African American: >60 mL/min (ref >60)
Sodium: 139 mEq/L (ref 135–145)
Total Protein: 7.2 g/dL (ref 6.0–8.3)

## 2010-09-11 LAB — CBC WITH DIFFERENTIAL/PLATELET
Lymphocytes Relative: 58 % — ABNORMAL HIGH (ref 12–46)
Lymphs Abs: 3.1 10*3/uL (ref 0.7–4.0)
Neutrophils Relative %: 29 % — ABNORMAL LOW (ref 43–77)
Platelets: 297 10*3/uL (ref 150–400)
RBC: 5.12 MIL/uL (ref 4.22–5.81)
WBC: 5.4 10*3/uL (ref 4.0–10.5)

## 2010-09-11 LAB — T-HELPER CELL (CD4) - (RCID CLINIC ONLY)
CD4 % Helper T Cell: 23 % — ABNORMAL LOW (ref 33–55)
CD4 T Cell Abs: 800 uL (ref 400–2700)

## 2010-09-11 LAB — RPR

## 2010-09-24 ENCOUNTER — Ambulatory Visit: Payer: Self-pay | Admitting: Adult Health

## 2010-09-27 ENCOUNTER — Ambulatory Visit (INDEPENDENT_AMBULATORY_CARE_PROVIDER_SITE_OTHER): Payer: Self-pay | Admitting: Adult Health

## 2010-09-27 ENCOUNTER — Encounter: Payer: Self-pay | Admitting: Adult Health

## 2010-09-27 DIAGNOSIS — M797 Fibromyalgia: Secondary | ICD-10-CM

## 2010-09-27 DIAGNOSIS — Z79899 Other long term (current) drug therapy: Secondary | ICD-10-CM

## 2010-09-27 DIAGNOSIS — B20 Human immunodeficiency virus [HIV] disease: Secondary | ICD-10-CM

## 2010-09-27 DIAGNOSIS — Z21 Asymptomatic human immunodeficiency virus [HIV] infection status: Secondary | ICD-10-CM

## 2010-09-27 DIAGNOSIS — IMO0001 Reserved for inherently not codable concepts without codable children: Secondary | ICD-10-CM

## 2010-09-27 DIAGNOSIS — K006 Disturbances in tooth eruption: Secondary | ICD-10-CM

## 2010-09-27 MED ORDER — HYDROCODONE-ACETAMINOPHEN 5-325 MG PO TABS: 1.0000 | ORAL_TABLET | Freq: Four times a day (QID) | ORAL | Status: AC | PRN

## 2010-09-27 NOTE — Patient Instructions (Signed)

## 2010-09-27 NOTE — Progress Notes (Signed)
  Subjective:    Patient ID: Eduardo Leon, male    DOB: [birth year], 25 y.o.   MRN: [id number]  HPI Eduardo Leon presents to clinic for scheduled followup. Remains adherent to his HIV medications with good tolerance and no complications. Relates that he remains intolerant to Cymbalta. Regardless of using ondansetron prior to taking the medication. Also, complaints of painful wisdom teeth, which has not yet been extracted, and he is scheduled for this in July 2012.   Review of Systems  Constitutional: Positive for fatigue.  HENT: Positive for dental problem.   Eyes: Negative.   Respiratory: Negative.   Cardiovascular: Negative.   Gastrointestinal: Positive for nausea.  Genitourinary: Negative.   Musculoskeletal: Positive for myalgias, back pain and arthralgias.  Skin: Negative.   Neurological: Positive for headaches.  Hematological: Negative.   Psychiatric/Behavioral: Negative.        Objective:   Physical Exam  Constitutional: He is oriented to person, place, and time. He appears well-developed and well-nourished. No distress.  HENT:  Head: Normocephalic and atraumatic.  Right Ear: External ear normal.  Left Ear: External ear normal.       Wisdom teeth in right posterior lower row remain impacted, but not inflamed.  Eyes: Conjunctivae and EOM are normal. Pupils are equal, round, and reactive to light.  Neck: Normal range of motion.  Cardiovascular: Normal rate and regular rhythm.   Pulmonary/Chest: Effort normal and breath sounds normal.  Abdominal: Soft. Bowel sounds are normal.  Musculoskeletal: Normal range of motion.  Neurological: He is alert and oriented to person, place, and time. No cranial nerve deficit. He exhibits normal muscle tone. Coordination normal.  Skin: Skin is warm and dry.  Psychiatric: He has a normal mood and affect. His behavior is normal. Judgment and thought content normal.          Assessment & Plan:  1. HIV. Labs obtained 09/10/2010 show a CD4  count of 800 at 23% with a viral load of less than 20 copies/mL. Clinically stable on current regimen. Recommend continuing present management, following up again in 4 months with repeat fasting labs in 2 weeks prior to his appointment.  2. Fibromyalgia. While this has not been completely ascertained, we recommend a dose reduction from 60 mg to 30 mg. However, his medication remains in capsule form. Therefore, it would be necessary for [place] to discuss possibly obtaining, Cymbalta, via Patient Assistant Program. Written patient information regarding fibromyalgia was provided at discharge.  3. Impacted Wisdom Teeth. We will renew his hydrocodone/APAP 5/325 one by mouth every 6 hours when necessary pain, dispensed 30 no refills. Encouraged to keep his appointment to have his teeth extracted.  Verbally acknowledged all information that was provided to him and agreed with plan of care.

## 2010-09-30 ENCOUNTER — Ambulatory Visit: Payer: Self-pay

## 2010-10-04 ENCOUNTER — Telehealth: Payer: Self-pay | Admitting: *Deleted

## 2010-10-04 NOTE — Telephone Encounter (Signed)
States he cannot work due to his stress, depression, side effects from the meds & fibromyalgia. He has not worked in a "while"  & has depleted unemployment. Family is helping him as they can. He says he is physically unable to work. He has not been able to find any employment that he might be able to do. He asks for a letter outlining this to submit to [facility].

## 2010-10-07 NOTE — Telephone Encounter (Signed)
He called again & wants to know why NP will not write the letter. Sent to NP for review

## 2010-11-08 ENCOUNTER — Ambulatory Visit: Payer: Self-pay

## 2010-11-20 DIAGNOSIS — Z21 Asymptomatic human immunodeficiency virus [HIV] infection status: Secondary | ICD-10-CM

## 2010-11-20 DIAGNOSIS — B20 Human immunodeficiency virus [HIV] disease: Secondary | ICD-10-CM

## 2010-11-20 HISTORY — DX: Human immunodeficiency virus (HIV) disease: B20

## 2010-11-20 HISTORY — DX: Asymptomatic human immunodeficiency virus (hiv) infection status: Z21

## 2011-01-23 ENCOUNTER — Ambulatory Visit: Payer: Self-pay

## 2011-01-24 ENCOUNTER — Ambulatory Visit: Payer: Self-pay

## 2011-03-03 ENCOUNTER — Encounter (HOSPITAL_COMMUNITY): Payer: Self-pay | Admitting: *Deleted

## 2011-03-03 ENCOUNTER — Emergency Department (INDEPENDENT_AMBULATORY_CARE_PROVIDER_SITE_OTHER)
Admission: EM | Admit: 2011-03-03 | Discharge: 2011-03-03 | Disposition: A | Discharge status: Home / Self Care | Payer: Self-pay | Source: Home / Self Care

## 2011-03-03 DIAGNOSIS — N342 Other urethritis: Secondary | ICD-10-CM

## 2011-03-03 DIAGNOSIS — J069 Acute upper respiratory infection, unspecified: Secondary | ICD-10-CM

## 2011-03-03 HISTORY — DX: Fibromyalgia: M79.7

## 2011-03-03 HISTORY — DX: Depression, unspecified: F32.A

## 2011-03-03 HISTORY — DX: Anxiety disorder, unspecified: F41.9

## 2011-03-03 HISTORY — DX: Human immunodeficiency virus (HIV) disease: B20

## 2011-03-03 HISTORY — DX: Major depressive disorder, single episode, unspecified: F32.9

## 2011-03-03 MED ORDER — LIDOCAINE HCL (PF) 1 % IJ SOLN
INTRAMUSCULAR | Status: AC
  Filled 2011-03-03: qty 5

## 2011-03-03 MED ORDER — CEFTRIAXONE SODIUM 250 MG IJ SOLR
INTRAMUSCULAR | Status: AC
  Filled 2011-03-03: qty 250

## 2011-03-03 MED ORDER — CEFTRIAXONE SODIUM 250 MG IJ SOLR
250.0000 mg | Freq: Once | INTRAMUSCULAR | Status: AC
  Administered 2011-03-03: 250 mg via INTRAMUSCULAR

## 2011-03-03 MED ORDER — DOXYCYCLINE HYCLATE 100 MG PO CAPS: 100.0000 mg | ORAL_CAPSULE | Freq: Two times a day (BID) | ORAL | Status: AC

## 2011-03-03 NOTE — ED Notes (Signed)
Pt c/o sorethroat for approx 2 weeks, burns and hurts to swallow.  Concerned now because he is also having burning/tingling with urination onset about same time.  Did notice a discharge this am.

## 2011-03-03 NOTE — ED Provider Notes (Signed)
History     CSN: [id number] Arrival date & time: 03/03/2011 12:32 PM   None     Chief Complaint  Patient presents with  . Sore Throat  . Penile Discharge    (Consider location/radiation/quality/duration/timing/severity/associated sxs/prior treatment) Patient is a 25 y.o. male presenting with pharyngitis and penile discharge. The history is provided by the patient.  Sore Throat This is a new problem. The current episode started more than 1 week ago (with nasal congestion and post nasal drainage, congestion is yellow in color). The problem occurs constantly. The problem has not changed since onset.Pertinent negatives include no chest pain, no abdominal pain, no headaches and no shortness of breath. The symptoms are aggravated by swallowing. Relieved by: Chloraseptic spray. The treatment provided mild relief.  Penile Discharge This is a new problem. The current episode started more than 2 days ago. The problem has not changed since onset.Pertinent negatives include no chest pain, no abdominal pain, no headaches and no shortness of breath. Associated symptoms comments: Intermittent dysuria x 2 weeks. The symptoms are aggravated by nothing. The symptoms are relieved by nothing. He has tried nothing for the symptoms. The treatment provided no relief.    Past Medical History  Diagnosis Date  . HIV (human immunodeficiency virus infection)   . Depression   . Anxiety   . Fibromyalgia     History reviewed. No pertinent past surgical history.  Family History  Problem Relation Age of Onset  . Hypertension Father     History  Substance Use Topics  . Smoking status: Never Smoker   . Smokeless tobacco: Never Used  . Alcohol Use: No      Review of Systems  Constitutional: Negative for fever and chills.  HENT: Positive for congestion, sore throat, rhinorrhea and postnasal drip. Negative for ear pain, sneezing and trouble swallowing.   Respiratory: Negative for cough and shortness of  breath.   Cardiovascular: Negative for chest pain.  Gastrointestinal: Negative for abdominal pain.  Genitourinary: Positive for dysuria and discharge. Negative for urgency, frequency, scrotal swelling, genital sores, penile pain and testicular pain.  Neurological: Negative for headaches.    Allergies  Shellfish-derived products  Home Medications   Current Outpatient Rx  Name Route Sig Dispense Refill  . EMTRICITABINE-TENOFOVIR 200-300 MG PO TABS Oral Take 1 tablet by mouth daily.      Marland Kitchen RALTEGRAVIR POTASSIUM 400 MG PO TABS Oral Take 400 mg by mouth 2 (two) times daily.      Marland Kitchen CETIRIZINE HCL 10 MG PO TABS Oral Take 1 tablet (10 mg total) by mouth daily. 30 tablet 11  . DULOXETINE HCL 60 MG PO CPEP  1/2 tab po qd 30 capsule 0  . ONDANSETRON HCL 4 MG PO TABS  One tablet by mouth 30 minutes before medications as directed. 30 tablet 2    BP 136/87  Pulse 78  Temp(Src) 98.4 F (36.9 C) (Oral)  Resp 16  SpO2 100%  Physical Exam  Nursing note and vitals reviewed. Constitutional: He appears well-developed and well-nourished. No distress.  HENT:  Head: Normocephalic and atraumatic.  Right Ear: Tympanic membrane, external ear and ear canal normal.  Left Ear: Tympanic membrane, external ear and ear canal normal.  Nose: Nose normal.  Mouth/Throat: Uvula is midline and mucous membranes are normal. Posterior oropharyngeal erythema present. No oropharyngeal exudate or posterior oropharyngeal edema.  Neck: Neck supple.  Cardiovascular: Normal rate, regular rhythm and normal heart sounds.   Pulmonary/Chest: Effort normal and breath sounds normal. No  respiratory distress.  Genitourinary: Testes normal and penis normal.  Lymphadenopathy:    He has no cervical adenopathy.  Neurological: He is alert.  Skin: Skin is warm and dry.  Psychiatric: He has a normal mood and affect.    ED Course  Procedures (including critical care time)   Labs Reviewed  GC/CHLAMYDIA PROBE AMP, GENITAL   No  results found.   No diagnosis found.    MDM  GC/Chlamydia pending.           Melody Comas, [place] 03/03/11 1425

## 2011-03-03 NOTE — ED Provider Notes (Signed)
Medical screening examination/treatment/procedure(s) were performed by non-physician practitioner and as supervising physician I was immediately available for consultation/collaboration.  Raynald Blend, MD 03/03/11 [phone]

## 2011-03-04 ENCOUNTER — Telehealth: Payer: Self-pay | Admitting: Licensed Clinical Social Worker

## 2011-03-04 LAB — GC/CHLAMYDIA PROBE AMP, GENITAL: Chlamydia, DNA Probe: POSITIVE — AB

## 2011-03-04 NOTE — Telephone Encounter (Signed)
Atripla can cause false urine drug screen. I am not aware of isentress or truvad doing this. He made need to look for antoher type of excuse

## 2011-03-04 NOTE — Telephone Encounter (Signed)
Patient called stating that he has failed two drug screenings and he does not do drugs. He called concerned that maybe his HIV drugs are causing false positives. He is currently on Truvada and Isentress.  Can this patient get a letter stating that this is a possibility if it is in fact a true statement?

## 2011-03-05 ENCOUNTER — Telehealth (HOSPITAL_COMMUNITY): Payer: Self-pay | Admitting: *Deleted

## 2011-03-05 NOTE — ED Notes (Signed)
Labs and chart reviewed. The patient was pos. For Chlamydia. Pt. adequately treated with Rx. of Doxycycline.  [facility]HS form completed and faxed to the [facility]. Vassie Moselle 03/05/2011

## 2011-03-06 ENCOUNTER — Telehealth (HOSPITAL_COMMUNITY): Payer: Self-pay | Admitting: *Deleted

## 2011-03-21 ENCOUNTER — Other Ambulatory Visit: Payer: Self-pay

## 2011-03-26 NOTE — Telephone Encounter (Signed)
Spoke with the patient on this day and he is aware that his medication is not known for false positives.

## 2011-04-03 ENCOUNTER — Ambulatory Visit: Payer: Self-pay | Admitting: Infectious Diseases

## 2011-04-07 ENCOUNTER — Other Ambulatory Visit: Payer: Self-pay | Admitting: Infectious Diseases

## 2011-04-07 ENCOUNTER — Other Ambulatory Visit (INDEPENDENT_AMBULATORY_CARE_PROVIDER_SITE_OTHER): Payer: Self-pay

## 2011-04-07 DIAGNOSIS — Z79899 Other long term (current) drug therapy: Secondary | ICD-10-CM

## 2011-04-07 DIAGNOSIS — B20 Human immunodeficiency virus [HIV] disease: Secondary | ICD-10-CM

## 2011-04-07 LAB — COMPLETE METABOLIC PANEL WITH GFR
Alkaline Phosphatase: 81 U/L (ref 39–117)
CO2: 27 mEq/L (ref 19–32)
Creat: 1.03 mg/dL (ref 0.50–1.35)
GFR, Est African American: >89 mL/min
GFR, Est Non African American: >89 mL/min
Glucose, Bld: 93 mg/dL (ref 70–99)
Sodium: 141 mEq/L (ref 135–145)
Total Bilirubin: 0.4 mg/dL (ref 0.3–1.2)
Total Protein: 7.2 g/dL (ref 6.0–8.3)

## 2011-04-07 LAB — LIPID PANEL
Cholesterol: 164 mg/dL (ref 0–200)
Triglycerides: 89 mg/dL (ref <150)
VLDL: 18 mg/dL (ref 0–40)

## 2011-04-07 LAB — CBC WITH DIFFERENTIAL/PLATELET
Basophils Relative: 0 % (ref 0–1)
Eosinophils Absolute: 0 10*3/uL (ref 0.0–0.7)
Eosinophils Relative: 1 % (ref 0–5)
MCH: 30.9 pg (ref 26.0–34.0)
MCHC: 35 g/dL (ref 30.0–36.0)
MCV: 88.5 fL (ref 78.0–100.0)
Monocytes Relative: 12 % (ref 3–12)
Neutrophils Relative %: 25 % — ABNORMAL LOW (ref 43–77)
Platelets: 274 10*3/uL (ref 150–400)

## 2011-04-08 LAB — HIV-1 RNA QUANT-NO REFLEX-BLD
HIV 1 RNA Quant: 134 copies/mL — ABNORMAL HIGH (ref <20)
HIV-1 RNA Quant, Log: 2.13 {Log} — ABNORMAL HIGH (ref <1.30)

## 2011-04-08 LAB — T-HELPER CELL (CD4) - (RCID CLINIC ONLY): CD4 % Helper T Cell: 22 % — ABNORMAL LOW (ref 33–55)

## 2011-04-23 ENCOUNTER — Telehealth: Payer: Self-pay | Admitting: *Deleted

## 2011-04-23 ENCOUNTER — Ambulatory Visit: Payer: Self-pay | Admitting: Infectious Diseases

## 2011-04-23 NOTE — Telephone Encounter (Signed)
Called patient and left voice mail for him to reschedule his appointment.  He no showed today. Wendall Mola CMA

## 2011-04-25 ENCOUNTER — Telehealth: Payer: Self-pay | Admitting: *Deleted

## 2011-04-25 NOTE — Telephone Encounter (Signed)
Bloody diarrhea x 3 days. Went to [facility] who referred him to md in [place]. He did not keep the appt this am since he could not stop stooling.  Constant diarrhea per pt. Has not been able to eat x 3 days. Also has the flu. Has had a fever. Still has HA. Sent to ED for concerns for dehydration and bloody diarrhea. He agreed with this plan

## 2011-04-28 ENCOUNTER — Ambulatory Visit: Payer: Self-pay | Admitting: Infectious Diseases

## 2011-04-29 ENCOUNTER — Telehealth: Payer: Self-pay | Admitting: *Deleted

## 2011-04-29 NOTE — Telephone Encounter (Signed)
I called him to reschedule the missed appt. States he tried several times to get thru to reschedule but was unable to get thru. He has an appt in am at a GI specialist & states he will call us after that to reschedule his appt

## 2011-06-25 IMAGING — CT CT HEAD WO/W CM
3 series · 18 of 30 positions shown, 20 images · IV contrast (CONTRAST)
Comparison: [REDACTED]cervical spine CT 10/16/2006.

CLINICAL DATA: Progressive migraine with headaches three to four
times per week, photophobia, nausea, vomiting, weakness.

CT HEAD WITHOUT AND WITH CONTRAST
TECHNIQUE: Contiguous axial images were obtained from the base of
the skull through the vertex without and with intravenous contrast.
Contrast: Pre and post intravenous 100 ml [id number].

[Series 2: head w/o · axial · non-contrast · 0.49mm/px · z∈[+1025,+1135]mm · 8 of 30 slices shown, 10 images (1 of 2)]
[im 4/30  brain]
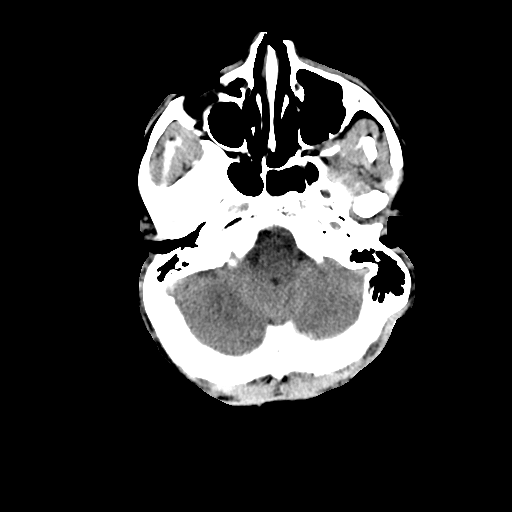
[im 4/30  bone]
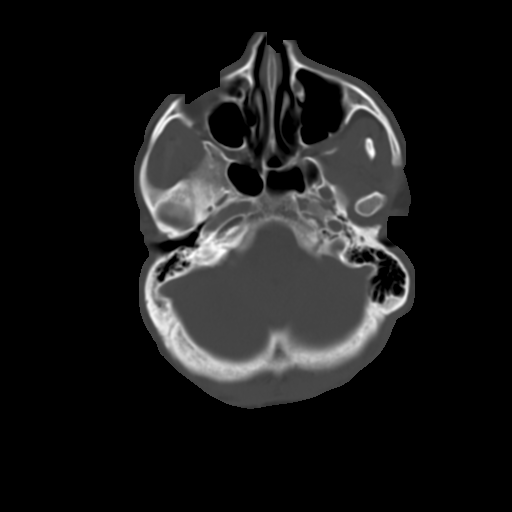
[im 7/30  brain]
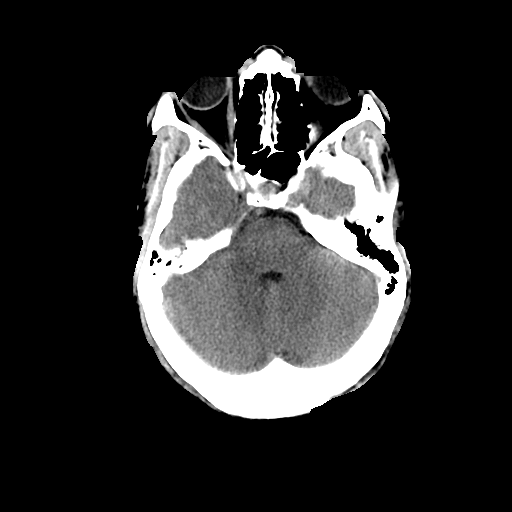
[im 10/30  brain]
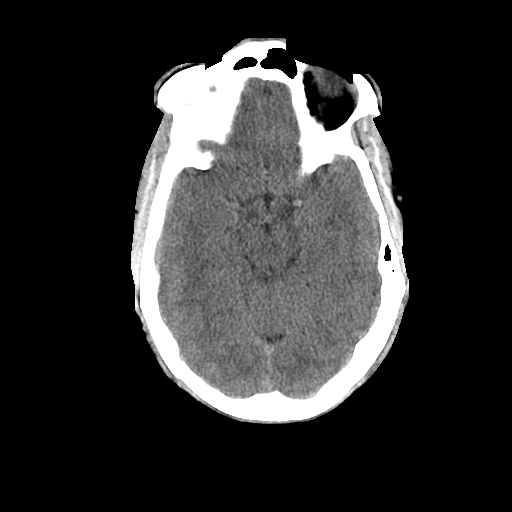
[im 13/30  brain]
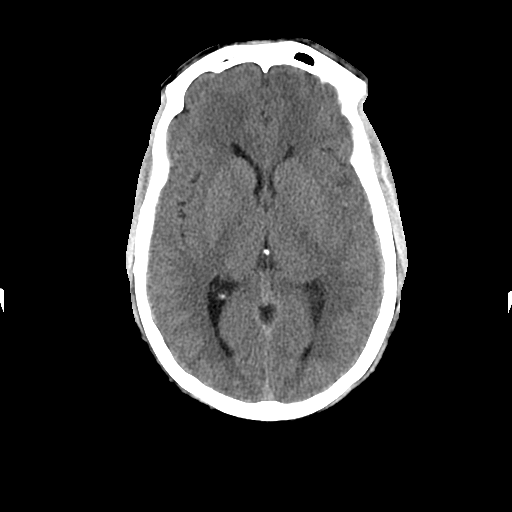
[im 17/30  brain]
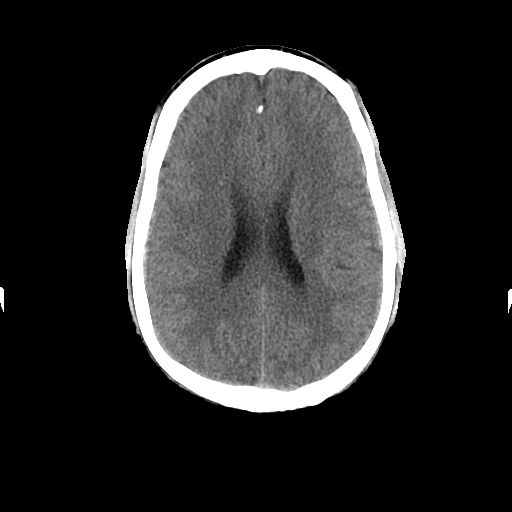
[im 17/30  bone]
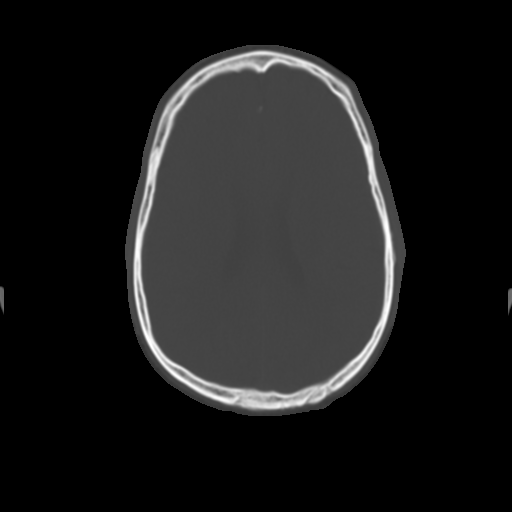
[im 20/30  brain]
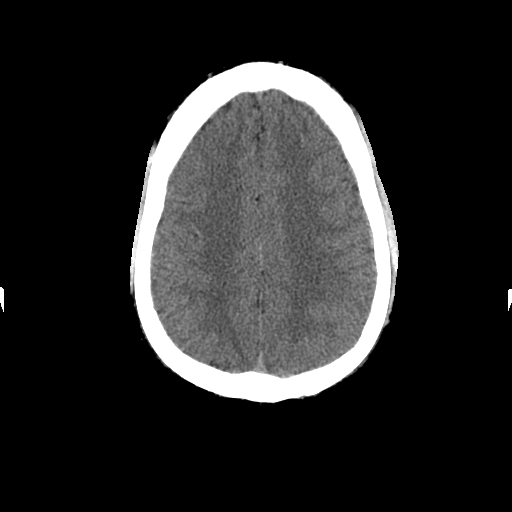
[im 23/30  brain]
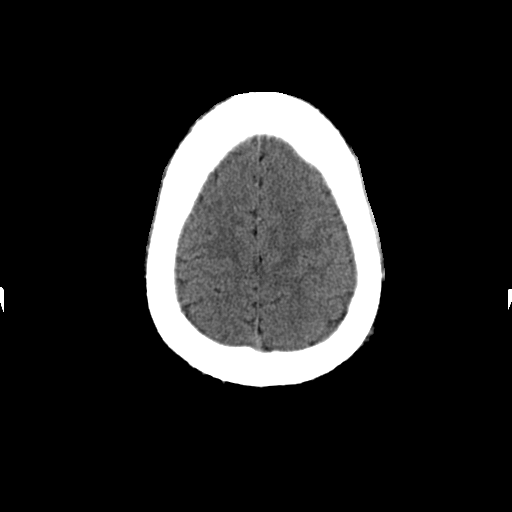
[im 26/30  brain]
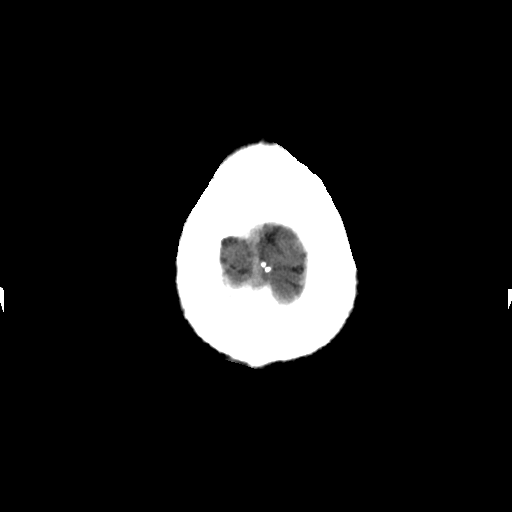

[Series 3: head w/o bone · axial · non-contrast · 0.49mm/px · z∈[+1025,+1040]mm · 2 of 30 slices shown]
[im 4/30  bone]
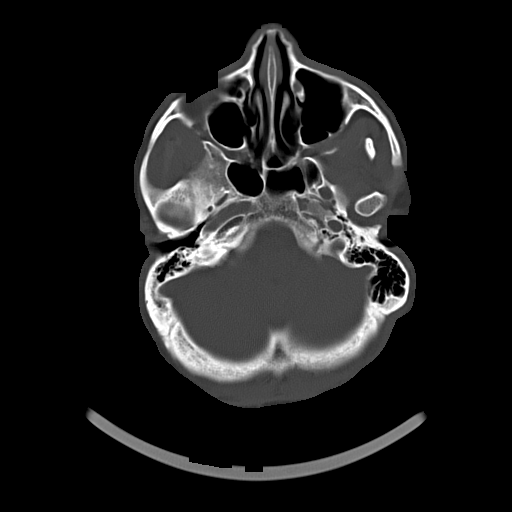
[im 7/30  bone]
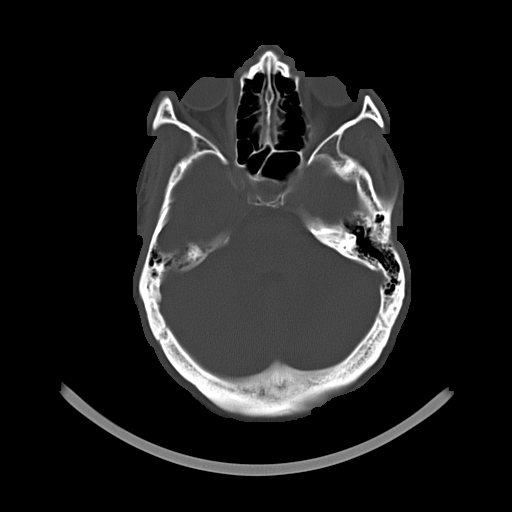

[Series 5: head w/o · axial · non-contrast · 0.49mm/px · z∈[+1025,+1135]mm · 8 of 30 slices shown (2 of 2)]
[im 4/30  brain]
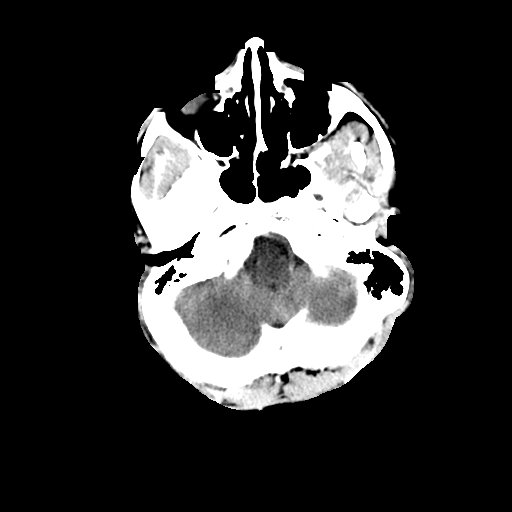
[im 7/30  brain]
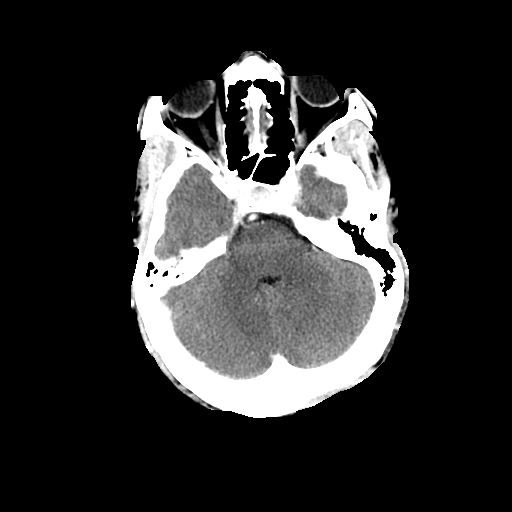
[im 10/30  brain]
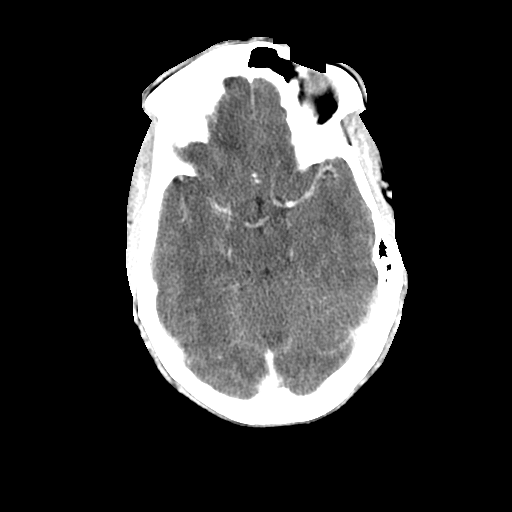
[im 13/30  brain]
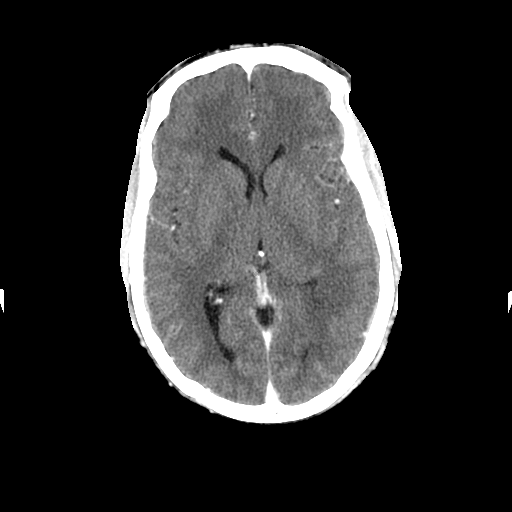
[im 17/30  brain]
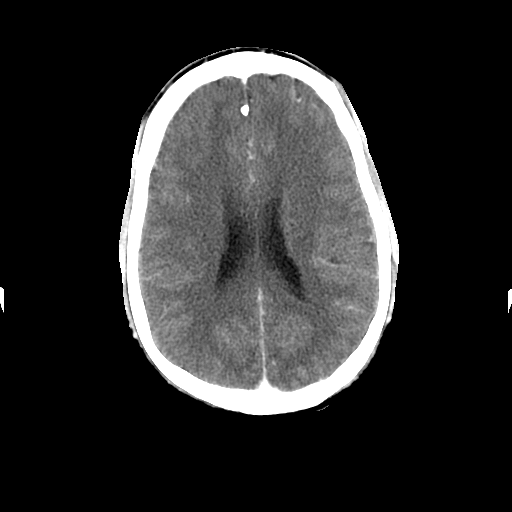
[im 20/30  brain]
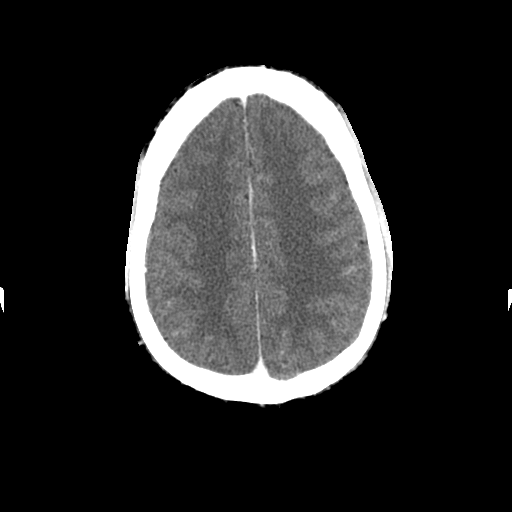
[im 23/30  brain]
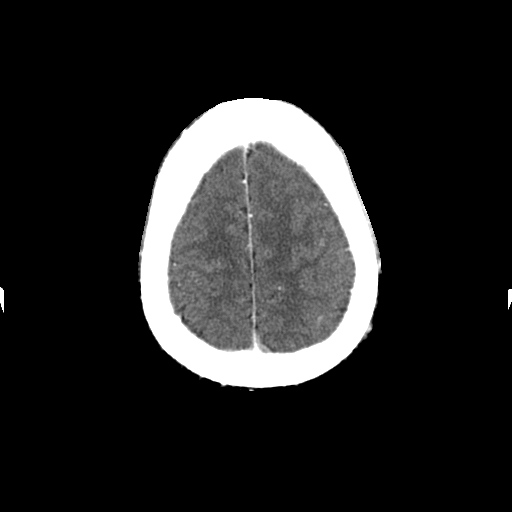
[im 26/30  brain]
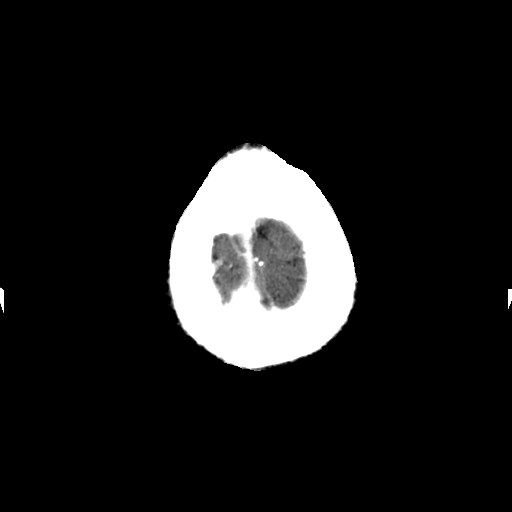

[18 of 30 positions shown; findings below may reference images not displayed]

FINDINGS: The cerebrum, cerebral ventricles, brainstem and
cerebellum appear normal for age.  Calvarium appears normal.
Cerebral vasculature unremarkable.
IMPRESSION: Normal.

## 2011-08-30 ENCOUNTER — Inpatient Hospital Stay (HOSPITAL_BASED_OUTPATIENT_CLINIC_OR_DEPARTMENT_OTHER)
Admission: EM | Admit: 2011-08-30 | Discharge: 2011-08-31 | DRG: 603 | Disposition: A | Discharge status: Home / Self Care | Payer: Self-pay | Attending: Internal Medicine | Admitting: Internal Medicine

## 2011-08-30 ENCOUNTER — Encounter (HOSPITAL_BASED_OUTPATIENT_CLINIC_OR_DEPARTMENT_OTHER): Payer: Self-pay | Admitting: *Deleted

## 2011-08-30 ENCOUNTER — Other Ambulatory Visit: Payer: Self-pay | Admitting: Internal Medicine

## 2011-08-30 DIAGNOSIS — Z8249 Family history of ischemic heart disease and other diseases of the circulatory system: Secondary | ICD-10-CM

## 2011-08-30 DIAGNOSIS — F32A Depression, unspecified: Secondary | ICD-10-CM | POA: Insufficient documentation

## 2011-08-30 DIAGNOSIS — M797 Fibromyalgia: Secondary | ICD-10-CM

## 2011-08-30 DIAGNOSIS — Z79899 Other long term (current) drug therapy: Secondary | ICD-10-CM

## 2011-08-30 DIAGNOSIS — Z21 Asymptomatic human immunodeficiency virus [HIV] infection status: Secondary | ICD-10-CM | POA: Diagnosis present

## 2011-08-30 DIAGNOSIS — L0291 Cutaneous abscess, unspecified: Secondary | ICD-10-CM

## 2011-08-30 DIAGNOSIS — Z833 Family history of diabetes mellitus: Secondary | ICD-10-CM

## 2011-08-30 DIAGNOSIS — F329 Major depressive disorder, single episode, unspecified: Secondary | ICD-10-CM | POA: Insufficient documentation

## 2011-08-30 DIAGNOSIS — L02219 Cutaneous abscess of trunk, unspecified: Principal | ICD-10-CM | POA: Diagnosis present

## 2011-08-30 DIAGNOSIS — B20 Human immunodeficiency virus [HIV] disease: Secondary | ICD-10-CM | POA: Insufficient documentation

## 2011-08-30 DIAGNOSIS — Z23 Encounter for immunization: Secondary | ICD-10-CM

## 2011-08-30 DIAGNOSIS — L02211 Cutaneous abscess of abdominal wall: Secondary | ICD-10-CM | POA: Diagnosis present

## 2011-08-30 DIAGNOSIS — F418 Other specified anxiety disorders: Secondary | ICD-10-CM

## 2011-08-30 DIAGNOSIS — F341 Dysthymic disorder: Secondary | ICD-10-CM | POA: Diagnosis present

## 2011-08-30 HISTORY — DX: Migraine, unspecified, not intractable, without status migrainosus: G43.909

## 2011-08-30 LAB — BASIC METABOLIC PANEL
BUN: 8 mg/dL (ref 6–23)
CO2: 29 mEq/L (ref 19–32)
Chloride: 101 mEq/L (ref 96–112)
Creatinine, Ser: 1 mg/dL (ref 0.50–1.35)
GFR calc Af Amer: >90 mL/min (ref >90)
Glucose, Bld: 86 mg/dL (ref 70–99)
Potassium: 3.8 mEq/L (ref 3.5–5.1)

## 2011-08-30 LAB — CBC
HCT: 45.9 % (ref 39.0–52.0)
Hemoglobin: 16 g/dL (ref 13.0–17.0)
MCV: 86.4 fL (ref 78.0–100.0)
RBC: 5.31 MIL/uL (ref 4.22–5.81)
RDW: 13.2 % (ref 11.5–15.5)
WBC: 13.8 10*3/uL — ABNORMAL HIGH (ref 4.0–10.5)

## 2011-08-30 MED ORDER — EMTRICITABINE-TENOFOVIR DF 200-300 MG PO TABS
1.0000 | ORAL_TABLET | Freq: Every day | ORAL | Status: DC
  Administered 2011-08-31: 1 via ORAL
  Filled 2011-08-30: qty 1

## 2011-08-30 MED ORDER — LIDOCAINE-EPINEPHRINE 2 %-1:100000 IJ SOLN
INTRAMUSCULAR | Status: AC
  Administered 2011-08-30: 19:00:00
  Filled 2011-08-30: qty 1

## 2011-08-30 MED ORDER — RALTEGRAVIR POTASSIUM 400 MG PO TABS
400.0000 mg | ORAL_TABLET | Freq: Two times a day (BID) | ORAL | Status: DC
  Administered 2011-08-31: 400 mg via ORAL
  Filled 2011-08-30 (×3): qty 1

## 2011-08-30 MED ORDER — PIPERACILLIN-TAZOBACTAM 3.375 G IVPB
3.3750 g | Freq: Once | INTRAVENOUS | Status: AC
  Administered 2011-08-30: 3.375 g via INTRAVENOUS
  Filled 2011-08-30 (×2): qty 50

## 2011-08-30 MED ORDER — SODIUM CHLORIDE 0.9 % IV SOLN
750.0000 mg | Freq: Three times a day (TID) | INTRAVENOUS | Status: DC
  Administered 2011-08-31: 750 mg via INTRAVENOUS
  Filled 2011-08-30 (×2): qty 750

## 2011-08-30 MED ORDER — HYDROCODONE-ACETAMINOPHEN 5-325 MG PO TABS
1.0000 | ORAL_TABLET | ORAL | Status: DC | PRN
  Administered 2011-08-30: 2 via ORAL
  Administered 2011-08-31: 1 via ORAL
  Administered 2011-08-31: 2 via ORAL
  Filled 2011-08-30 (×2): qty 2
  Filled 2011-08-30: qty 1

## 2011-08-30 MED ORDER — PIPERACILLIN-TAZOBACTAM 3.375 G IVPB
3.3750 g | Freq: Three times a day (TID) | INTRAVENOUS | Status: DC
  Administered 2011-08-31 (×2): 3.375 g via INTRAVENOUS
  Filled 2011-08-30 (×3): qty 50

## 2011-08-30 MED ORDER — HYDROMORPHONE HCL PF 1 MG/ML IJ SOLN
1.0000 mg | Freq: Once | INTRAMUSCULAR | Status: AC
  Administered 2011-08-30: 1 mg via INTRAVENOUS

## 2011-08-30 MED ORDER — HEPARIN SODIUM (PORCINE) 5000 UNIT/ML IJ SOLN
5000.0000 [IU] | Freq: Three times a day (TID) | INTRAMUSCULAR | Status: DC
  Filled 2011-08-30 (×4): qty 1

## 2011-08-30 MED ORDER — VANCOMYCIN HCL IN DEXTROSE 1-5 GM/200ML-% IV SOLN
1000.0000 mg | Freq: Once | INTRAVENOUS | Status: AC
  Administered 2011-08-30: 1000 mg via INTRAVENOUS
  Filled 2011-08-30: qty 200

## 2011-08-30 MED ORDER — ONDANSETRON HCL 4 MG/2ML IJ SOLN: 4.0000 mg | Freq: Four times a day (QID) | INTRAMUSCULAR | Status: DC | PRN

## 2011-08-30 MED ORDER — HYDROMORPHONE HCL PF 1 MG/ML IJ SOLN
INTRAMUSCULAR | Status: AC
  Administered 2011-08-30: 1 mg via INTRAVENOUS
  Filled 2011-08-30: qty 1

## 2011-08-30 NOTE — Progress Notes (Signed)
ANTIBIOTIC CONSULT NOTE - INITIAL  Pharmacy Consult for Vancomycin, Zosyn Indication: Abcess on abdomen  Allergies  Allergen Reactions  . Shellfish-Derived Products Anaphylaxis, Hives and Itching    Patient Measurements: Height: 5\' 5"  (165.1 cm) Weight: 162 lb (73.483 kg) IBW/kg (Calculated) : 61.5    Vital Signs: Temp: 99.7 F (37.6 C) (05/11 2147) Temp src: Oral (05/11 2147) BP: 116/75 mmHg (05/11 2147) Pulse Rate: 95  (05/11 2147) Intake/Output from previous day:   Intake/Output from this shift:    Labs:  [facility] 08/30/11 1815  WBC 13.8*  HGB 16.0  PLT 306  LABCREA --  CREATININE 1.00   Estimated Creatinine Clearance: 97.4 ml/min (by C-G formula based on Cr of 1). No results found for this basename: VANCOTROUGH:2,VANCOPEAK:2,VANCORANDOM:2,GENTTROUGH:2,GENTPEAK:2,GENTRANDOM:2,TOBRATROUGH:2,TOBRAPEAK:2,TOBRARND:2,AMIKACINPEAK:2,AMIKACINTROU:2,AMIKACIN:2, in the last 72 hours   Microbiology: No results found for this or any previous visit (from the past 720 hour(s)).  Medical History: Past Medical History  Diagnosis Date  . HIV (human immunodeficiency virus infection)   . Depression   . Anxiety   . Fibromyalgia     Medications:  Prescriptions prior to admission  Medication Sig Dispense Refill  . cetirizine (ZYRTEC) 10 MG tablet Take 1 tablet (10 mg total) by mouth daily.  30 tablet  11  . DULoxetine (CYMBALTA) 60 MG capsule 1/2 tab po qd  30 capsule  0  . emtricitabine-tenofovir (TRUVADA) 200-300 MG per tablet Take 1 tablet by mouth daily.        . raltegravir (ISENTRESS) 400 MG tablet Take 400 mg by mouth 2 (two) times daily.         Assessment: 26 year old HIV positive patient admitted with abscess on abdomen. Noted WBC elevated, Scr WNL at baseline. Pt is afebrile on presentation. Zosyn 3.375gm given at 2000 @ [facility], Vanc given ~1900 today.  Goal of Therapy:  Vancomycin trough level 10-15 mcg/ml  Plan:  Measure antibiotic drug levels at steady  state Follow up culture results - Zosyn 3.375gm IV Q8h, each dose infused over 4 hours- will start at [place] - Vancomycin 750mg  IV q 8h, start at 0400  Romie Tay K. Allena Katz, PharmD, BCPS.  Clinical Pharmacist Pager [phone]. 08/30/2011 10:26 PM
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
## 2011-08-30 NOTE — ED Provider Notes (Signed)
History   This chart was scribed for Eduardo Quarry, MD by Shari Heritage. The patient was seen in room MH02/MH02. Patient's care was started at 1644.     CSN: [id number]  Arrival date & time 08/30/11  1644   First MD Initiated Contact with Patient 08/30/11 1748      Chief Complaint  Patient presents with  . Abscess    (Consider location/radiation/quality/duration/timing/severity/associated sxs/prior treatment) The history is provided by the patient. No language interpreter was used.   Eduardo Leon is a 26 y.o. male who presents to the Emergency Department complaining of a severe, raised lower abdominal abscess onset a couple of days ago associated with erythema, tenderness, fever and chills. Patient with h/o of HIV infection. He was diagnosed in August 2012. Patient regularly visits the Infectious Disease Clinic at [facility]. Patient is currently taking Truvada and Isentress. Patient took an Ibuprofen this morning around 9 am. Patient has never been hospitalized before. Patient is not a smoker and is sexually active.  Past Medical History  Diagnosis Date  . HIV (human immunodeficiency virus infection)   . Depression   . Anxiety   . Fibromyalgia     History reviewed. No pertinent past surgical history.  Family History  Problem Relation Age of Onset  . Hypertension Father     History  Substance Use Topics  . Smoking status: Never Smoker   . Smokeless tobacco: Never Used  . Alcohol Use: No      Review of Systems  Constitutional: Positive for fever and chills.  Cardiovascular: Negative for chest pain.  Gastrointestinal: Positive for abdominal pain. Negative for nausea, vomiting and diarrhea.  Genitourinary: Negative for dysuria.  Skin: Positive for color change (Redness around area of abscess).  All other systems reviewed and are negative.    Allergies  Shellfish-derived products  Home Medications   Current Outpatient Rx  Name Route Sig Dispense Refill  .  CETIRIZINE HCL 10 MG PO TABS Oral Take 1 tablet (10 mg total) by mouth daily. 30 tablet 11  . DULOXETINE HCL 60 MG PO CPEP  1/2 tab po qd 30 capsule 0  . EMTRICITABINE-TENOFOVIR 200-300 MG PO TABS Oral Take 1 tablet by mouth daily.      Marland Kitchen RALTEGRAVIR POTASSIUM 400 MG PO TABS Oral Take 400 mg by mouth 2 (two) times daily.        BP 124/81  Pulse 88  Temp(Src) 100 F (37.8 C) (Oral)  Resp 16  Ht 5\' 5"  (1.651 m)  Wt 162 lb (73.483 kg)  BMI 26.96 kg/m2  SpO2 99%  Physical Exam  Nursing note and vitals reviewed. Constitutional: He is oriented to person, place, and time. He appears well-developed and well-nourished.  HENT:  Head: Normocephalic and atraumatic.  Eyes: Conjunctivae and EOM are normal. Pupils are equal, round, and reactive to light.  Neck: Normal range of motion. Neck supple.  Cardiovascular: Normal rate and regular rhythm.   Pulmonary/Chest: Effort normal and breath sounds normal.  Abdominal: Soft. Bowel sounds are normal. There is tenderness.       Abscess in the left lateral midline of lower abdomen  Musculoskeletal: Normal range of motion.  Neurological: He is alert and oriented to person, place, and time.  Skin: Skin is warm and dry. There is erythema.  Psychiatric: He has a normal mood and affect.    ED Course  INCISION AND DRAINAGE Date/Time: 08/30/2011 7:39 PM Performed by: Eduardo Leon Authorized by: Eduardo Leon Consent: Verbal  consent obtained. Consent given by: patient Patient understanding: patient states understanding of the procedure being performed Patient identity confirmed: verbally with patient Time out: Immediately prior to procedure a "time out" was called to verify the correct patient, procedure, equipment, support staff and site/side marked as required. Type: abscess Body area: trunk Anesthesia: local infiltration Local anesthetic: lidocaine 1% with epinephrine Anesthetic total: 5 ml Patient sedated: no Scalpel size: 11 Incision  type: single straight Complexity: complex Drainage: purulent Drainage amount: moderate Wound treatment: wound left open Patient tolerance: Patient tolerated the procedure well with no immediate complications.   (including critical care time) DIAGNOSTIC STUDIES: Oxygen Saturation is 99% on room air, normal by my interpretation.    COORDINATION OF CARE: 5:55PM- Patient informed of current plan for treatment and evaluation and agrees with plan at this time. Patient has an abdominal abscess that needs to be drained. Patient felt warm, but temperature was 98.2 at 4:50PM in ED. Will order another temperature check.  6:42PM- Checked on patient and took another look at abdominal abscess.  Labs Reviewed  CBC  BASIC METABOLIC PANEL  CULTURE, BLOOD (ROUTINE X 2)  CULTURE, BLOOD (ROUTINE X 2)  WOUND CULTURE   No results found.   No diagnosis found.    MDM   Patient presents today with abscess on abdomen. I indeed as per my note. Patient with history of HIV although white count today at 13,800. He does have a fever. He is given Zosyn and they here in the emergency department. His care has been discussed with Dr. Denton Meek and the patient is accepted to the teaching service at [facility]. Dr. Aundria Rud is the attending on call.     I personally performed the services described in this documentation, which was scribed in my presence. The recorded information has been reviewed and considered.   Eduardo Quarry, MD 08/30/11 [id number]

## 2011-08-30 NOTE — ED Notes (Signed)
Pt has raised reddened area on abd.Marland Kitchen

## 2011-08-31 DIAGNOSIS — F329 Major depressive disorder, single episode, unspecified: Secondary | ICD-10-CM | POA: Insufficient documentation

## 2011-08-31 DIAGNOSIS — F32A Depression, unspecified: Secondary | ICD-10-CM | POA: Insufficient documentation

## 2011-08-31 LAB — BASIC METABOLIC PANEL
BUN: 9 mg/dL (ref 6–23)
CO2: 24 mEq/L (ref 19–32)
Chloride: 101 mEq/L (ref 96–112)
Creatinine, Ser: 1.04 mg/dL (ref 0.50–1.35)
Glucose, Bld: 105 mg/dL — ABNORMAL HIGH (ref 70–99)

## 2011-08-31 LAB — CBC
HCT: 43.7 % (ref 39.0–52.0)
MCH: 30.2 pg (ref 26.0–34.0)
MCHC: 34.3 g/dL (ref 30.0–36.0)
MCV: 88.1 fL (ref 78.0–100.0)
RDW: 13.4 % (ref 11.5–15.5)
WBC: 15.5 10*3/uL — ABNORMAL HIGH (ref 4.0–10.5)

## 2011-08-31 MED ORDER — PNEUMOCOCCAL VAC POLYVALENT 25 MCG/0.5ML IJ INJ
0.5000 mL | INJECTION | INTRAMUSCULAR | Status: AC
  Administered 2011-08-31: 0.5 mL via INTRAMUSCULAR
  Filled 2011-08-31: qty 0.5

## 2011-08-31 MED ORDER — DOXYCYCLINE HYCLATE 100 MG PO TABS
100.0000 mg | ORAL_TABLET | Freq: Two times a day (BID) | ORAL | Status: DC
  Administered 2011-08-31: 100 mg via ORAL
  Filled 2011-08-31 (×2): qty 1

## 2011-08-31 MED ORDER — HYDROCODONE-ACETAMINOPHEN 5-325 MG PO TABS: 1.0000 | ORAL_TABLET | ORAL | Status: DC | PRN

## 2011-08-31 MED ORDER — DOXYCYCLINE HYCLATE 100 MG PO TABS: 100.0000 mg | ORAL_TABLET | Freq: Two times a day (BID) | ORAL | Status: DC

## 2011-08-31 MED ORDER — DIPHENHYDRAMINE HCL 50 MG/ML IJ SOLN
12.5000 mg | Freq: Once | INTRAMUSCULAR | Status: AC
  Administered 2011-08-31: 12.5 mg via INTRAVENOUS

## 2011-08-31 MED ORDER — DIPHENHYDRAMINE HCL 50 MG/ML IJ SOLN
INTRAMUSCULAR | Status: AC
  Filled 2011-08-31: qty 1

## 2011-08-31 NOTE — Discharge Summary (Signed)
Internal Medicine Teaching [facility] Discharge Note  Name: Eduardo Leon MRN: [id number] DOB: 1985/05/09 26 y.o. CSN: [id number]   Date of Admission: 08/30/2011  4:54 PM Date of Discharge: 08/31/2011 Attending Physician: Ulyess Mort, MD  Discharge Diagnosis:  Active Problems:   HIV INFECTION   Abdominal wall abscess    Depression  Discharge Medications: Medication List  As of 08/31/2011 10:49 AM   TAKE these medications         cetirizine 10 MG tablet   Commonly known as: ZYRTEC   Take 1 tablet (10 mg total) by mouth daily.      doxycycline 100 MG tablet   Commonly known as: VIBRA-TABS   Take 1 tablet (100 mg total) by mouth every 12 (twelve) hours.      DULoxetine 60 MG capsule   Commonly known as: CYMBALTA   1/2 tab po qd      emtricitabine-tenofovir 200-300 MG per tablet   Commonly known as: TRUVADA   Take 1 tablet by mouth daily.      HYDROcodone-acetaminophen 5-325 MG per tablet   Commonly known as: NORCO   Take 1-2 tablets by mouth every 4 (four) hours as needed.      raltegravir 400 MG tablet   Commonly known as: ISENTRESS   Take 400 mg by mouth 2 (two) times daily.            Disposition and follow-up:   Eduardo Leon was discharged from [facility] in Stable condition.  At the hospital follow up visit please address following issues:  1. Make sure his abscess is healing well. Please follow up blood culture and wound culture which were pending at discharge.  2. Repeat CBC to make sure his leukocytosis resolved. 3. Please follow up his CD4 and VL which were pending at discharge.  Follow-up Appointments: Follow-up Information    Follow up with FARRINGTON,BRADFORD A, NP. (patient promised to call and make his own appointment in 1 to 2 weeks.)    Contact information:   [address] [facility] [address] [phone]         Discharge Orders    Future Orders Please Complete By Expires   Increase  activity slowly      Leave dressing on - Keep it clean, dry, and intact until clinic visit      Call MD for:  temperature >100.4      Call MD for:  severe uncontrolled pain      Call MD for:  extreme fatigue         Consultations:  none   Procedures Performed:   Abscess I&D   Admission HPI:  Patient is a 26 year old man with history of HIV and depression.   He reports that 2 days PTA, he felt an infected hair follicle on his lower abdomen, but was unable to visualize anything.  He reports subjective fever/chills and subsequently tried ibuprofen.  He slept through the night and and in the morning noticed a boil on his lower abdomen.  He tried to express fluid, which worsened the pain (described as sharp, 9/10).   No alleviating factors. He has never had similar symptoms in the past.  He then presented to [facility].  Patient was found to have an abdominal abscess with low-grade fever and leukocytosis, which was I&D'd.  He was then transferred to [facility] for further observation and treatment.  Physical Exam: Blood pressure 116/75, pulse 95, temperature 99.7 F (37.6 C),  temperature source Oral, resp. rate 18, height 5\' 5"  (1.651 m), weight 162 lb (73.483 kg), SpO2 97.00%.   Tmax = 100  General: resting in bed, no acute distress, cooperative with exam HEENT: PERRL, EOMI, no scleral icterus, no oral thrush Cardiac: RRR, no rubs, murmurs or gallops Pulm: clear to auscultation bilaterally, moving normal volumes of air Abd: soft, nontender, nondistended, BS present Ext: warm and well perfused, no pedal edema Skin: Lower left abdominal wall incision 1.5-2cm with minimal bloody drainage with surrounding induration & erythema, not significantly warm to touch, no swelling, somewhat TTP, no odor Neuro: alert and oriented X3, cranial nerves II-XII grossly intact  Lab results: Basic Metabolic Panel:  Basename  08/30/11 1815   NA  139   K  3.8   CL  101   CO2  29   GLUCOSE  86     BUN  8   CREATININE  1.00   CALCIUM  9.5   MG  --   PHOS  --    CBC:  Basename  08/30/11 1815   WBC  13.8*   NEUTROABS  --   HGB  16.0   HCT  45.9   MCV  86.4   PLT  306      Hospital Course by problem list:  # Abdominal abscess: With leukocytosis & low grade temperature; Status post incision and drainage at [facility].  Wound culture and blood cultures were obtained at [facility]. Patient was treated with Vanc and Zosyn in the emergency department. Pain was controlled with 1 mg of Dilaudid. On the second day, he is afebrile and local pain improved dramatically. We switched his IV antibiotics to oral doxycycline. After dress is changed, he was discharged home on oral doxycycline for total of 10 days. He is to follow up with his ID physician, Dr. Sundra Aland in 1 to 2 weeks.   # HIV: Last CD4 = 650, viral load 134 (04/07/11);  continued his home medication in hospital and on discharge (Truvada and Isentress). He is to follow up with his ID physician, Dr. Sundra Aland in 2 weeks. Pending CD 4 and viral load at discharge.   #Depression:  Stable.  Denies SI/HI. Continued his home medication at discharge.   Discharge Vitals:  BP 112/64  Pulse 95  Temp(Src) 99.5 F (37.5 C) (Oral)  Resp 18  Ht 5\' 5"  (1.651 m)  Wt 162 lb (73.483 kg)  BMI 26.96 kg/m2  SpO2 98%  Discharge Labs:  Results for orders placed during the hospital encounter of 08/30/11 (from the past 24 hour(s))  CBC     Status: Abnormal   Collection Time   08/30/11  6:15 PM      Component Value Range   WBC 13.8 (*) 4.0 - 10.5 (K/uL)   RBC 5.31  4.22 - 5.81 (MIL/uL)   Hemoglobin 16.0  13.0 - 17.0 (g/dL)   HCT 82.9  56.2 - 13.0 (%)   MCV 86.4  78.0 - 100.0 (fL)   MCH 30.1  26.0 - 34.0 (pg)   MCHC 34.9  30.0 - 36.0 (g/dL)   RDW 86.5  78.4 - 69.6 (%)   Platelets 306  150 - 400 (K/uL)  BASIC METABOLIC PANEL     Status: Normal   Collection Time   08/30/11  6:15 PM      Component Value  Range   Sodium 139  135 - 145 (mEq/L)   Potassium 3.8  3.5 - 5.1 (mEq/L)  Chloride 101  96 - 112 (mEq/L)   CO2 29  19 - 32 (mEq/L)   Glucose, Bld 86  70 - 99 (mg/dL)   BUN 8  6 - 23 (mg/dL)   Creatinine, Ser 1.61  0.50 - 1.35 (mg/dL)   Calcium 9.5  8.4 - 09.6 (mg/dL)   GFR calc non Af Amer >90  >90 (mL/min)   GFR calc Af Amer >90  >90 (mL/min)  BASIC METABOLIC PANEL     Status: Abnormal   Collection Time   08/31/11  7:25 AM      Component Value Range   Sodium 139  135 - 145 (mEq/L)   Potassium 3.5  3.5 - 5.1 (mEq/L)   Chloride 101  96 - 112 (mEq/L)   CO2 24  19 - 32 (mEq/L)   Glucose, Bld 105 (*) 70 - 99 (mg/dL)   BUN 9  6 - 23 (mg/dL)   Creatinine, Ser 0.45  0.50 - 1.35 (mg/dL)   Calcium 9.2  8.4 - 40.9 (mg/dL)   GFR calc non Af Amer >90  >90 (mL/min)   GFR calc Af Amer >90  >90 (mL/min)  CBC     Status: Abnormal   Collection Time   08/31/11  7:25 AM      Component Value Range   WBC 15.5 (*) 4.0 - 10.5 (K/uL)   RBC 4.96  4.22 - 5.81 (MIL/uL)   Hemoglobin 15.0  13.0 - 17.0 (g/dL)   HCT 81.1  91.4 - 78.2 (%)   MCV 88.1  78.0 - 100.0 (fL)   MCH 30.2  26.0 - 34.0 (pg)   MCHC 34.3  30.0 - 36.0 (g/dL)   RDW 95.6  21.3 - 08.6 (%)   Platelets 273  150 - 400 (K/uL)    Signed: Lorretta Harp 08/31/2011, 10:49 AM   Time Spent on Discharge: 

## 2011-08-31 NOTE — H&P (Signed)
IM Attending  89 man with 042 (CD4 = 650) on ART.  Admitted for boil on abdomen.  He says boii several cm in diameter and hed low grade fever and malaise.  Had I & D at urgent care and sent over for admission.  Better overnight on vanc and zosyn.  Ho24me today on oral antibiotic.

## 2011-08-31 NOTE — Progress Notes (Signed)
Discharged to home with family via wheelchair to private vehicle, discharge instructions and med reconc eviewed with pt verbalizes understanding, Berle Mull RN

## 2011-08-31 NOTE — Discharge Instructions (Signed)
1. Please follow-up with Dr. Sundra Aland in 1 to 2 weeks, better in 2 weeks. 2. Please take all medications as prescribed.  3. If you have worsening of your symptoms or new symptoms arise, please call the clinic [phone]), or go to the ER immediately if symptoms are severe

## 2011-08-31 NOTE — Progress Notes (Signed)
Patient complaining of itching on prior shift and is scratching in his sleep.  Due to concern that may be due to Vancomycin, called MD on call Dr. Saralyn Pilar who recommended that I stop antibiotics on this patient.  Will continue to follow.  Roland Rack, RN

## 2011-09-01 MED FILL — Hydromorphone HCl Preservative Free (PF) Inj 2 MG/ML: INTRAMUSCULAR | Qty: 1 | Status: AC

## 2011-09-02 LAB — WOUND CULTURE

## 2011-09-06 LAB — CULTURE, BLOOD (ROUTINE X 2)
Culture  Setup Time: 201305120204
Culture  Setup Time: 201305120205
Culture: NO GROWTH

## 2011-09-09 ENCOUNTER — Other Ambulatory Visit: Payer: Self-pay | Admitting: Infectious Disease

## 2011-09-09 ENCOUNTER — Inpatient Hospital Stay (HOSPITAL_COMMUNITY)
Admission: AD | Admit: 2011-09-09 | Discharge: 2011-09-10 | DRG: 572 | Disposition: A | Discharge status: Home / Self Care | Payer: Self-pay | Source: Ambulatory Visit | Attending: Infectious Disease | Admitting: Infectious Disease

## 2011-09-09 ENCOUNTER — Ambulatory Visit (INDEPENDENT_AMBULATORY_CARE_PROVIDER_SITE_OTHER): Payer: Self-pay | Admitting: Infectious Disease

## 2011-09-09 ENCOUNTER — Encounter (HOSPITAL_COMMUNITY): Payer: Self-pay | Admitting: Internal Medicine

## 2011-09-09 ENCOUNTER — Encounter: Payer: Self-pay | Admitting: Infectious Disease

## 2011-09-09 ENCOUNTER — Other Ambulatory Visit (INDEPENDENT_AMBULATORY_CARE_PROVIDER_SITE_OTHER): Payer: Self-pay | Admitting: Surgery

## 2011-09-09 VITALS — BP 121/80 | HR 63 | Temp 98.0°F | Ht 65.0 in | Wt 162.0 lb

## 2011-09-09 DIAGNOSIS — Z21 Asymptomatic human immunodeficiency virus [HIV] infection status: Secondary | ICD-10-CM | POA: Diagnosis present

## 2011-09-09 DIAGNOSIS — F32A Depression, unspecified: Secondary | ICD-10-CM | POA: Diagnosis present

## 2011-09-09 DIAGNOSIS — L02211 Cutaneous abscess of abdominal wall: Secondary | ICD-10-CM | POA: Diagnosis present

## 2011-09-09 DIAGNOSIS — Z881 Allergy status to other antibiotic agents status: Secondary | ICD-10-CM

## 2011-09-09 DIAGNOSIS — B20 Human immunodeficiency virus [HIV] disease: Secondary | ICD-10-CM | POA: Diagnosis present

## 2011-09-09 DIAGNOSIS — G43909 Migraine, unspecified, not intractable, without status migrainosus: Secondary | ICD-10-CM | POA: Diagnosis present

## 2011-09-09 DIAGNOSIS — Z8249 Family history of ischemic heart disease and other diseases of the circulatory system: Secondary | ICD-10-CM

## 2011-09-09 DIAGNOSIS — R112 Nausea with vomiting, unspecified: Secondary | ICD-10-CM | POA: Insufficient documentation

## 2011-09-09 DIAGNOSIS — F329 Major depressive disorder, single episode, unspecified: Secondary | ICD-10-CM | POA: Diagnosis present

## 2011-09-09 DIAGNOSIS — Z888 Allergy status to other drugs, medicaments and biological substances status: Secondary | ICD-10-CM

## 2011-09-09 DIAGNOSIS — M797 Fibromyalgia: Secondary | ICD-10-CM

## 2011-09-09 DIAGNOSIS — A4902 Methicillin resistant Staphylococcus aureus infection, unspecified site: Secondary | ICD-10-CM | POA: Insufficient documentation

## 2011-09-09 DIAGNOSIS — F341 Dysthymic disorder: Secondary | ICD-10-CM

## 2011-09-09 DIAGNOSIS — IMO0001 Reserved for inherently not codable concepts without codable children: Secondary | ICD-10-CM | POA: Diagnosis present

## 2011-09-09 DIAGNOSIS — Z833 Family history of diabetes mellitus: Secondary | ICD-10-CM

## 2011-09-09 DIAGNOSIS — L03319 Cellulitis of trunk, unspecified: Secondary | ICD-10-CM

## 2011-09-09 DIAGNOSIS — F418 Other specified anxiety disorders: Secondary | ICD-10-CM

## 2011-09-09 DIAGNOSIS — L02219 Cutaneous abscess of trunk, unspecified: Principal | ICD-10-CM

## 2011-09-09 LAB — CBC
HCT: 44.2 % (ref 39.0–52.0)
Hemoglobin: 15.7 g/dL (ref 13.0–17.0)
RBC: 5.09 MIL/uL (ref 4.22–5.81)
WBC: 5.5 10*3/uL (ref 4.0–10.5)

## 2011-09-09 LAB — COMPREHENSIVE METABOLIC PANEL
BUN: 7 mg/dL (ref 6–23)
Calcium: 9.6 mg/dL (ref 8.4–10.5)
Creatinine, Ser: 0.84 mg/dL (ref 0.50–1.35)
GFR calc Af Amer: >90 mL/min (ref >90)
Glucose, Bld: 91 mg/dL (ref 70–99)
Sodium: 136 mEq/L (ref 135–145)
Total Protein: 8.9 g/dL — ABNORMAL HIGH (ref 6.0–8.3)

## 2011-09-09 LAB — DIFFERENTIAL
Lymphocytes Relative: 62 % — ABNORMAL HIGH (ref 12–46)
Monocytes Absolute: 0.6 10*3/uL (ref 0.1–1.0)
Monocytes Relative: 11 % (ref 3–12)
Neutro Abs: 1.4 10*3/uL — ABNORMAL LOW (ref 1.7–7.7)

## 2011-09-09 LAB — PROTIME-INR
INR: 1.03 (ref 0.00–1.49)
Prothrombin Time: 13.7 seconds (ref 11.6–15.2)

## 2011-09-09 MED ORDER — MORPHINE SULFATE 4 MG/ML IJ SOLN: 4.0000 mg | INTRAMUSCULAR | Status: DC

## 2011-09-09 MED ORDER — MORPHINE SULFATE 4 MG/ML IJ SOLN: 4.0000 mg | INTRAMUSCULAR | Status: DC | PRN

## 2011-09-09 MED ORDER — CLONAZEPAM 0.5 MG PO TABS: 0.5000 mg | ORAL_TABLET | Freq: Every evening | ORAL | Status: DC | PRN

## 2011-09-09 MED ORDER — DOCUSATE SODIUM 100 MG PO CAPS
100.0000 mg | ORAL_CAPSULE | Freq: Two times a day (BID) | ORAL | Status: DC
  Administered 2011-09-09: 100 mg via ORAL
  Filled 2011-09-09 (×2): qty 1

## 2011-09-09 MED ORDER — DULOXETINE HCL 60 MG PO CPEP
60.0000 mg | ORAL_CAPSULE | Freq: Every day | ORAL | Status: DC
  Filled 2011-09-09: qty 1

## 2011-09-09 MED ORDER — ALUM & MAG HYDROXIDE-SIMETH 200-200-20 MG/5ML PO SUSP: 30.0000 mL | Freq: Four times a day (QID) | ORAL | Status: DC | PRN

## 2011-09-09 MED ORDER — RITONAVIR 100 MG PO TABS: 100.0000 mg | ORAL_TABLET | Freq: Every day | ORAL | Status: DC

## 2011-09-09 MED ORDER — PROMETHAZINE HCL 25 MG PO TABS: 12.5000 mg | ORAL_TABLET | Freq: Four times a day (QID) | ORAL | Status: DC | PRN

## 2011-09-09 MED ORDER — SODIUM CHLORIDE 0.9 % IV SOLN
250.0000 mL | INTRAVENOUS | Status: DC | PRN
  Administered 2011-09-09: 250 mL via INTRAVENOUS

## 2011-09-09 MED ORDER — SODIUM CHLORIDE 0.9 % IJ SOLN: 3.0000 mL | INTRAMUSCULAR | Status: DC | PRN

## 2011-09-09 MED ORDER — ACETAMINOPHEN 650 MG RE SUPP: 650.0000 mg | Freq: Four times a day (QID) | RECTAL | Status: DC | PRN

## 2011-09-09 MED ORDER — SULFAMETHOXAZOLE-TRIMETHOPRIM 800-160 MG PO TABS: 2.0000 | ORAL_TABLET | Freq: Two times a day (BID) | ORAL | Status: DC

## 2011-09-09 MED ORDER — DARUNAVIR ETHANOLATE 800 MG PO TABS: 800.0000 mg | ORAL_TABLET | Freq: Every day | ORAL | Status: DC

## 2011-09-09 MED ORDER — ACETAMINOPHEN 325 MG PO TABS: 650.0000 mg | ORAL_TABLET | Freq: Four times a day (QID) | ORAL | Status: DC | PRN

## 2011-09-09 MED ORDER — CLONAZEPAM 0.5 MG PO TABS
0.5000 mg | ORAL_TABLET | Freq: Every day | ORAL | Status: DC
  Administered 2011-09-09: 0.5 mg via ORAL
  Filled 2011-09-09: qty 1

## 2011-09-09 MED ORDER — ENOXAPARIN SODIUM 40 MG/0.4ML ~~LOC~~ SOLN
40.0000 mg | SUBCUTANEOUS | Status: DC
  Filled 2011-09-09: qty 0.4

## 2011-09-09 MED ORDER — SODIUM CHLORIDE 0.9 % IJ SOLN
3.0000 mL | Freq: Two times a day (BID) | INTRAMUSCULAR | Status: DC
  Administered 2011-09-09: 3 mL via INTRAVENOUS

## 2011-09-09 MED ORDER — PROMETHAZINE HCL 25 MG PO TABS: 25.0000 mg | ORAL_TABLET | ORAL | Status: DC

## 2011-09-09 MED ORDER — CLINDAMYCIN PHOSPHATE 600 MG/50ML IV SOLN
600.0000 mg | Freq: Three times a day (TID) | INTRAVENOUS | Status: DC
  Administered 2011-09-09 – 2011-09-10 (×4): 600 mg via INTRAVENOUS
  Filled 2011-09-09 (×6): qty 50

## 2011-09-09 NOTE — Progress Notes (Signed)
Subjective:    Patient ID: Eduardo Leon, male    DOB: January 14, 1986, 26 y.o.   MRN: [id number]  HPI  26 year old [place] American man with HIV previously well controlled on isentress and truvada but recently noncompliant. He ran out of meds, "never received phone call back:" and then tried to stretch his meds out" He was recently admitted to the B service for soft tissue abscess with MRSA sp I and D in [facility] placed on vanco/zosyn x 3 days then dc with doxycycline. He never filled the doxy because it plus the narcotic he was rx cost too"much money.:" He claims abscess is still smaler but on my exam is at least 5 to 6cm in dimensions and tender. I think he needs much more extensive debridement but cost to go to [facility] is $200. I will attempt I and D here and put him back on antibiotics (DS bactrim two bid). I am switching him to PI based regimen for HIV with greater forgiveness for noncompliance. I will give him clonazepam for his anxiety do. I spent greater than 60 minutes with the patient including greater than 50% of time in face to face counsel of the patient and in coordination of their care.  We prepped the patient in the usual sterile manner. 15ml of 1% lidocaine was used to infiltrate the skin around his abscess. I was able to extend his incision site to the right and deeper slightly but he did not tolerate further attempts to incise. I milked the abscess from his right hand side and removed bloody material. In applying pressure from his left side some purulent material was expressed but he began yelling in pain. In view of the SIZE of this abscess and my inability to achieve adequete drainage at the bedside I will admit him for IV vancomycin and surgical consultation with [facility]    Review of Systems  Constitutional: Negative for fever, chills, diaphoresis, activity change, appetite change, fatigue and unexpected weight change.  HENT: Negative for congestion, sore throat, rhinorrhea, sneezing, trouble  swallowing and sinus pressure.   Eyes: Negative for photophobia and visual disturbance.  Respiratory: Negative for cough, chest tightness, shortness of breath, wheezing and stridor.   Cardiovascular: Negative for chest pain, palpitations and leg swelling.  Gastrointestinal: Negative for nausea, vomiting, abdominal pain, diarrhea, constipation, blood in stool, abdominal distention and anal bleeding.  Genitourinary: Negative for dysuria, hematuria, flank pain and difficulty urinating.  Musculoskeletal: Negative for myalgias, back pain, joint swelling, arthralgias and gait problem.  Skin: Positive for wound. Negative for color change, pallor and rash.  Neurological: Negative for dizziness, tremors, weakness and light-headedness.  Hematological: Negative for adenopathy. Does not bruise/bleed easily.  Psychiatric/Behavioral: Negative for behavioral problems, confusion, sleep disturbance, dysphoric mood, decreased concentration and agitation.       Objective:   Physical Exam  Constitutional: He is oriented to person, place, and time. He appears well-developed and well-nourished. No distress.  HENT:  Head: Normocephalic and atraumatic.  Mouth/Throat: Oropharynx is clear and moist. No oropharyngeal exudate.  Eyes: Conjunctivae and EOM are normal. Pupils are equal, round, and reactive to light. No scleral icterus.  Neck: Normal range of motion. Neck supple. No JVD present.  Cardiovascular: Normal rate, regular rhythm and normal heart sounds.  Exam reveals no gallop and no friction rub.   No murmur heard. Pulmonary/Chest: Effort normal and breath sounds normal. No respiratory distress. He has no wheezes. He has no rales. He exhibits no tenderness.  Abdominal: He exhibits no  distension and no mass. There is no tenderness. There is no rebound and no guarding.    Musculoskeletal: He exhibits no edema and no tenderness.  Lymphadenopathy:    He has no cervical adenopathy.  Neurological: He is alert  and oriented to person, place, and time. He has normal reflexes. He exhibits normal muscle tone. Coordination normal.  Skin: Skin is warm and dry. He is not diaphoretic. No erythema. No pallor.  Psychiatric: His behavior is normal. Judgment and thought content normal. His mood appears anxious.          Assessment & Plan:  Abdominal wall abscess I will attempt bedside I and D but I am worried this will NOT be sufficient. By measurement he meets criteria for admission for IV antibiotics. I will discuss this option as well (admission for IV vancomycin), will treat for protracted course  MRSA infection See above plan  HIV INFECTION Will check HIV genotype, HIV integrase INH genotype, change to prezista norvir and truvada with premedication with phenergan  ANXIETY DEPRESSION Try klonopin 0.5mg  qhs

## 2011-09-09 NOTE — Assessment & Plan Note (Signed)
See above plan. 

## 2011-09-09 NOTE — Assessment & Plan Note (Signed)
Try klonopin 0.5mg  qhs
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
## 2011-09-09 NOTE — Patient Instructions (Signed)
Your new regimen is   Prezista 800mg  (orange tablet) once daily  Norvir 100mg  (white tablet) once daily  Truvada (blue tablet) once daily  Take phenergan (promethazine) one hour prior to antivirals  I will give you klonopin for your anxiety  I will rx bactrim DS two tablets twice daily for your abscess  I can attempt to I and D this but you will   You will need to come back in next 10 days to reassess

## 2011-09-09 NOTE — Assessment & Plan Note (Addendum)
I will attempt bedside I and D but I am worried this will NOT be sufficient. By measurement he meets criteria for admission for IV antibiotics.  See above patient did not tolerate simple bedside I and D by me for sufficient drainage. While the abscess is smaller it is NOT spontaneously draining and its size is sufficient to warrant IV vancomycin. I will admit him for IV vancomycin AND MORE IMPORTANTLY CONSULTATION WITH [facility] FOR MORE EXTENSIVE I AND D WITH IV ANESTHETICS AND ANXIOLYTICS

## 2011-09-09 NOTE — Assessment & Plan Note (Signed)
Will check HIV genotype, HIV integrase INH genotype, change to prezista norvir and truvada with premedication with phenergan
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
## 2011-09-10 ENCOUNTER — Encounter (HOSPITAL_COMMUNITY): Payer: Self-pay | Admitting: Anesthesiology

## 2011-09-10 ENCOUNTER — Encounter (HOSPITAL_COMMUNITY): Payer: Self-pay | Admitting: *Deleted

## 2011-09-10 ENCOUNTER — Encounter (HOSPITAL_COMMUNITY)
Admission: AD | Disposition: A | Discharge status: Home / Self Care | Payer: Self-pay | Source: Ambulatory Visit | Attending: Infectious Disease

## 2011-09-10 ENCOUNTER — Inpatient Hospital Stay (HOSPITAL_COMMUNITY): Payer: Self-pay | Admitting: Anesthesiology

## 2011-09-10 DIAGNOSIS — L02219 Cutaneous abscess of trunk, unspecified: Secondary | ICD-10-CM

## 2011-09-10 DIAGNOSIS — L03319 Cellulitis of trunk, unspecified: Secondary | ICD-10-CM

## 2011-09-10 HISTORY — PX: IRRIGATION AND DEBRIDEMENT ABSCESS: SHX5252

## 2011-09-10 LAB — CBC
Hemoglobin: 16.2 g/dL (ref 13.0–17.0)
MCH: 31 pg (ref 26.0–34.0)
MCHC: 35.5 g/dL (ref 30.0–36.0)
MCV: 87.2 fL (ref 78.0–100.0)

## 2011-09-10 LAB — COMPREHENSIVE METABOLIC PANEL
ALT: 55 U/L — ABNORMAL HIGH (ref 0–53)
AST: 30 U/L (ref 0–37)
Albumin: 3.5 g/dL (ref 3.5–5.2)
CO2: 25 mEq/L (ref 19–32)
Calcium: 9.5 mg/dL (ref 8.4–10.5)
Chloride: 104 mEq/L (ref 96–112)
GFR calc non Af Amer: >90 mL/min (ref >90)
Sodium: 140 mEq/L (ref 135–145)
Total Bilirubin: 0.3 mg/dL (ref 0.3–1.2)

## 2011-09-10 SURGERY — IRRIGATION AND DEBRIDEMENT ABSCESS
Anesthesia: General | Site: Abdomen | Wound class: Dirty or Infected

## 2011-09-10 MED ORDER — SULFAMETHOXAZOLE-TRIMETHOPRIM 800-160 MG PO TABS: 1.0000 | ORAL_TABLET | Freq: Two times a day (BID) | ORAL | Status: DC

## 2011-09-10 MED ORDER — PROPOFOL 10 MG/ML IV EMUL
INTRAVENOUS | Status: DC | PRN
  Administered 2011-09-10: 200 mg via INTRAVENOUS

## 2011-09-10 MED ORDER — DIPHENHYDRAMINE HCL 25 MG PO CAPS
50.0000 mg | ORAL_CAPSULE | Freq: Four times a day (QID) | ORAL | Status: DC | PRN
  Administered 2011-09-10: 50 mg via ORAL
  Filled 2011-09-10: qty 2

## 2011-09-10 MED ORDER — CLINDAMYCIN PHOSPHATE 600 MG/4ML IJ SOLN
INTRAMUSCULAR | Status: AC
  Filled 2011-09-10: qty 4

## 2011-09-10 MED ORDER — FENTANYL CITRATE 0.05 MG/ML IJ SOLN: 1.0000 ug/kg | INTRAMUSCULAR | Status: DC | PRN

## 2011-09-10 MED ORDER — NAPROXEN 500 MG PO TABS
500.0000 mg | ORAL_TABLET | Freq: Two times a day (BID) | ORAL | Status: DC
  Administered 2011-09-10: 500 mg via ORAL
  Filled 2011-09-10 (×2): qty 1

## 2011-09-10 MED ORDER — ACETAMINOPHEN 650 MG RE SUPP: 650.0000 mg | RECTAL | Status: DC | PRN

## 2011-09-10 MED ORDER — NAPROXEN SODIUM 550 MG PO TABS
550.0000 mg | ORAL_TABLET | Freq: Two times a day (BID) | ORAL | Status: DC
  Filled 2011-09-10 (×3): qty 1

## 2011-09-10 MED ORDER — FENTANYL CITRATE 0.05 MG/ML IJ SOLN
25.0000 ug | INTRAMUSCULAR | Status: DC | PRN
  Administered 2011-09-10 (×3): 50 ug via INTRAVENOUS

## 2011-09-10 MED ORDER — FENTANYL CITRATE 0.05 MG/ML IJ SOLN
INTRAMUSCULAR | Status: AC
  Filled 2011-09-10: qty 2

## 2011-09-10 MED ORDER — LACTATED RINGERS IV SOLN
INTRAVENOUS | Status: DC | PRN
  Administered 2011-09-10: 08:00:00 via INTRAVENOUS

## 2011-09-10 MED ORDER — OXYCODONE HCL 5 MG PO TABS
5.0000 mg | ORAL_TABLET | ORAL | Status: DC | PRN
  Administered 2011-09-10: 10 mg via ORAL
  Filled 2011-09-10: qty 2

## 2011-09-10 MED ORDER — MIDAZOLAM HCL 5 MG/5ML IJ SOLN
INTRAMUSCULAR | Status: DC | PRN
  Administered 2011-09-10: 2 mg via INTRAVENOUS

## 2011-09-10 MED ORDER — SODIUM CHLORIDE 0.9 % IJ SOLN: 3.0000 mL | Freq: Two times a day (BID) | INTRAMUSCULAR | Status: DC

## 2011-09-10 MED ORDER — OXYCODONE-ACETAMINOPHEN 5-325 MG PO TABS: 1.0000 | ORAL_TABLET | Freq: Four times a day (QID) | ORAL | Status: DC | PRN

## 2011-09-10 MED ORDER — DEXTROSE 5 % IV SOLN
INTRAVENOUS | Status: AC
  Filled 2011-09-10: qty 50

## 2011-09-10 MED ORDER — 0.9 % SODIUM CHLORIDE (POUR BTL) OPTIME
TOPICAL | Status: DC | PRN
  Administered 2011-09-10: 1000 mL

## 2011-09-10 MED ORDER — SODIUM CHLORIDE 0.9 % IV SOLN: 250.0000 mL | INTRAVENOUS | Status: DC | PRN

## 2011-09-10 MED ORDER — ACETAMINOPHEN 325 MG PO TABS: 650.0000 mg | ORAL_TABLET | ORAL | Status: DC | PRN

## 2011-09-10 MED ORDER — SODIUM CHLORIDE 0.9 % IJ SOLN: 3.0000 mL | INTRAMUSCULAR | Status: DC | PRN

## 2011-09-10 MED ORDER — ONDANSETRON HCL 4 MG/2ML IJ SOLN: 4.0000 mg | Freq: Four times a day (QID) | INTRAMUSCULAR | Status: DC | PRN

## 2011-09-10 MED ORDER — ACETAMINOPHEN 325 MG PO TABS: 650.0000 mg | ORAL_TABLET | ORAL | Status: AC | PRN

## 2011-09-10 MED ORDER — FENTANYL CITRATE 0.05 MG/ML IJ SOLN
INTRAMUSCULAR | Status: DC | PRN
  Administered 2011-09-10 (×2): 50 ug via INTRAVENOUS

## 2011-09-10 MED ORDER — ONDANSETRON HCL 4 MG/2ML IJ SOLN
INTRAMUSCULAR | Status: DC | PRN
  Administered 2011-09-10: 4 mg via INTRAVENOUS

## 2011-09-10 SURGICAL SUPPLY — 45 items
BENZOIN TINCTURE PRP APPL 2/3 (GAUZE/BANDAGES/DRESSINGS) IMPLANT
BLADE SURG 15 STRL LF DISP TIS (BLADE) ×1 IMPLANT
BLADE SURG 15 STRL SS (BLADE) ×1
BNDG COHESIVE 4X5 WHT NS (GAUZE/BANDAGES/DRESSINGS) IMPLANT
CANISTER SUCTION 2500CC (MISCELLANEOUS) ×2 IMPLANT
CLEANER TIP ELECTROSURG 2X2 (MISCELLANEOUS) IMPLANT
CLOTH BEACON ORANGE TIMEOUT ST (SAFETY) ×2 IMPLANT
CONT SPEC 4OZ CLIKSEAL STRL BL (MISCELLANEOUS) IMPLANT
COVER SURGICAL LIGHT HANDLE (MISCELLANEOUS) ×4 IMPLANT
DECANTER SPIKE VIAL GLASS SM (MISCELLANEOUS) ×2 IMPLANT
DRAPE LAPAROSCOPIC ABDOMINAL (DRAPES) ×2 IMPLANT
DRAPE LAPAROTOMY TRNSV 102X78 (DRAPE) IMPLANT
ELECT REM PT RETURN 9FT ADLT (ELECTROSURGICAL) ×2
ELECTRODE REM PT RTRN 9FT ADLT (ELECTROSURGICAL) ×1 IMPLANT
GAUZE PACKING IODOFORM 1/2 (PACKING) ×2 IMPLANT
GAUZE SPONGE 4X4 16PLY XRAY LF (GAUZE/BANDAGES/DRESSINGS) IMPLANT
GLOVE BIO SURGEON STRL SZ7.5 (GLOVE) ×2 IMPLANT
GLOVE BIO SURGEON STRL SZ8 (GLOVE) ×4 IMPLANT
GLOVE BIOGEL PI IND STRL 7.5 (GLOVE) ×1 IMPLANT
GLOVE BIOGEL PI INDICATOR 7.5 (GLOVE) ×1
GOWN PREVENTION PLUS XXLARGE (GOWN DISPOSABLE) IMPLANT
GOWN STRL NON-REIN LRG LVL3 (GOWN DISPOSABLE) IMPLANT
KIT BASIN OR (CUSTOM PROCEDURE TRAY) ×2 IMPLANT
KIT ROOM TURNOVER OR (KITS) ×2 IMPLANT
MARGIN MAP 10MM (MISCELLANEOUS) IMPLANT
NEEDLE HYPO 25GX1X1/2 BEV (NEEDLE) IMPLANT
NS IRRIG 1000ML POUR BTL (IV SOLUTION) ×2 IMPLANT
PACK SURGICAL SETUP 50X90 (CUSTOM PROCEDURE TRAY) ×2 IMPLANT
PAD ARMBOARD 7.5X6 YLW CONV (MISCELLANEOUS) ×4 IMPLANT
PENCIL BUTTON HOLSTER BLD 10FT (ELECTRODE) ×2 IMPLANT
SPONGE GAUZE 4X4 12PLY (GAUZE/BANDAGES/DRESSINGS) IMPLANT
SPONGE LAP 4X18 X RAY DECT (DISPOSABLE) IMPLANT
STRIP CLOSURE SKIN 1/4X4 (GAUZE/BANDAGES/DRESSINGS) IMPLANT
SUT ETHILON 3 0 FSL (SUTURE) IMPLANT
SUT VIC AB 4-0 SH 18 (SUTURE) IMPLANT
SUT VIC AB 5-0 P-3 18XBRD (SUTURE) IMPLANT
SUT VIC AB 5-0 P3 18 (SUTURE)
SYR BULB 3OZ (MISCELLANEOUS) ×2 IMPLANT
SYR CONTROL 10ML LL (SYRINGE) IMPLANT
TAPE CLOTH SURG 4X10 WHT LF (GAUZE/BANDAGES/DRESSINGS) ×2 IMPLANT
TOWEL OR 17X24 6PK STRL BLUE (TOWEL DISPOSABLE) IMPLANT
TOWEL OR 17X26 10 PK STRL BLUE (TOWEL DISPOSABLE) IMPLANT
TUBE CONNECTING 12X1/4 (SUCTIONS) ×2 IMPLANT
WATER STERILE IRR 1000ML POUR (IV SOLUTION) ×2 IMPLANT
YANKAUER SUCT BULB TIP NO VENT (SUCTIONS) ×2 IMPLANT

## 2011-09-10 NOTE — H&P (Signed)
Internal Medicine Attending Admission Note Date: 09/10/2011  Patient name: Eduardo Leon Medical record number: [id number] Date of birth: [birth year] Age: 26 y.o. Gender: male  I saw and evaluated the patient. I reviewed the resident's note and I agree with the resident's findings and plan as documented in the resident's note.  Chief Complaint(s): Abdominal abscess.  History - key components related to admission:  Eduardo Leon is a 26 year old man with a history of HIV whose last CD4 count 10 days ago was 380 who presents with abdominal wall pain and swelling for 2 weeks. He was recently discharged from the internal medicine teaching service on May 12 after drainage of a infected follicle. The culture was positive for MRSA. He was given a prescription for doxycycline for 10 days but could not afford the medication. The boil progressed to an abdominal abscess in the interim. He presented to the infectious disease clinic as a post hospital followup and an attempt was made to incise and drain the abscess. This procedure was aborted secondary to intolerable pain. He was therefore admitted to the internal medicine teaching service for further therapy. This morning he underwent a successful incision and drainage of the abdominal wall abscess and is without complaints other than peri-incisional pain.  Physical Exam - key components related to admission:  Filed Vitals:   09/10/11 1030 09/10/11 1039 09/10/11 1045 09/10/11 1051  BP: 127/78  117/70   Pulse: 76 76 82 87  Temp:      TempSrc:      Resp: 15 16 14 15   Height:      Weight:      SpO2: 95% 96% 93% 93%   Gen: Well-developed, well-nourished, man lying comfortably in bed in no acute distress. Lungs: Clear to auscultation bilaterally. Heart: Regular rate and rhythm without murmurs, rubs, or gallops. Abdomen: Infra- umbilical drained abscess with overlying postoperative bandage and mild spotting of blood over the incision. There is no abdominal  tenderness. Active bowel sounds. Extremities: Without edema.  Lab results:  Basic Metabolic Panel:  Basename 09/10/11 0610 09/09/11 1706  NA 140 136  K 4.0 3.9  CL 104 100  CO2 25 27  GLUCOSE 108* 91  BUN 9 7  CREATININE 0.85 0.84  CALCIUM 9.5 9.6  MG -- --  PHOS -- --   Liver Function Tests:  [facility] 09/10/11 0610 09/09/11 1706  AST 30 30  ALT 55* 54*  ALKPHOS 88 86  BILITOT 0.3 0.2*  PROT 8.6* 8.9*  ALBUMIN 3.5 3.7   CBC:  Basename 09/10/11 0610 09/09/11 1706  WBC 5.7 5.5  NEUTROABS -- 1.4*  HGB 16.2 15.7  HCT 45.6 44.2  MCV 87.2 86.8  PLT 369 371   Coagulation:  Basename 09/09/11 1706  INR 1.03   Assessment & Plan by Problem:  Eduardo Leon is a 26 year old man with a history of HIV and MRSA follicular boil who presents with an abdominal wall abscess at the site of the boil when he was unable to afford the doxycycline therapy. He underwent a successful incision and drainage in the operating suite this morning. He is feeling fine and is ready for discharge.  1) Abdominal wall abscess-MRSA: We will provide Eduardo Leon with a prescription for Bactrim DS one tablet by mouth twice a day for 10 days. This should be an affordable medication and allow him to complete his course of therapy. If followup is necessary in the surgery clinic they can arrange that appointment. Otherwise, we will schedule him back  into the infectious disease clinic to assess the wound and consider restarting his HIV therapy. I agree with the housestaff's plan to discharge him home today.

## 2011-09-10 NOTE — Preoperative (Signed)
Beta Blockers   Reason not to administer Beta Blockers:Not Applicable 

## 2011-09-10 NOTE — Clinical Social Work Note (Signed)
CSW received consult for medication assistance. CSW referred pt to [facility] to address medication. CSW reviewed chart and discussed with RN. CSW is signing off as no further needs identified. Please reconsult if a need arises prior to discharge.   Dede Query, MSW, Theresia Majors [phone]

## 2011-09-10 NOTE — Anesthesia Postprocedure Evaluation (Signed)
  Anesthesia Post-op Note  Patient: Eduardo Leon  Procedure(s) Performed: Procedure(s) (LRB): IRRIGATION AND DEBRIDEMENT ABSCESS (N/A)  Patient Location: PACU  Anesthesia Type: General  Level of Consciousness: awake  Airway and Oxygen Therapy: Patient Spontanous Breathing  Post-op Pain: mild  Post-op Assessment: Post-op Vital signs reviewed  Post-op Vital Signs: Reviewed  Complications: No apparent anesthesia complications

## 2011-09-10 NOTE — Progress Notes (Signed)
Medical Student Daily Progress Note  Subjective: Patient went to the OR this morning for drainage of this abdominal abscess. He had no complaints over night and was doing well prior to being called to the OR. Upon returning from the OR he is doing well and recovering. He was complaining of some itching and pain after the procedure for which he is being given some benadryl and oxy IR respectively.  He denies any fevers, chills, night sweats, CP, changes in bowels, dysphagia, dysuria, fatigue, or weight change. Patient is resting comfortably in bed upon exam.  Objective: Vital signs in last 24 hours: Filed Vitals:   09/09/11 1407 09/09/11 1800 09/09/11 2200 09/10/11 0600  BP: 116/75 115/75 134/84 129/83  Pulse: 72 73 79 78  Temp: 97.5 F (36.4 C) 97.7 F (36.5 C) 98.4 F (36.9 C) 97.8 F (36.6 C)  TempSrc: Oral Oral Oral Oral  Resp: 18 18 20 18   Height:  5\' 5"  (1.651 m)    Weight:  73.483 kg (162 lb)    SpO2: 100% 100% 98% 100%   Weight change:  No intake or output data in the 24 hours ending 09/10/11 0905 Physical Exam: BP 129/83  Pulse 78  Temp(Src) 97.8 F (36.6 C) (Oral)  Resp 18  Ht 5\' 5"  (1.651 m)  Wt 73.483 kg (162 lb)  BMI 26.96 kg/m2  SpO2 100% General appearance: alert, cooperative and no distress Head: Normocephalic, without obvious abnormality, atraumatic Neck: no adenopathy, no carotid bruit, no JVD, supple, symmetrical, trachea midline and thyroid not enlarged, symmetric, no tenderness/mass/nodules Back: symmetric, no curvature. ROM normal. No CVA tenderness. Lungs: clear to auscultation bilaterally Chest wall: no tenderness Heart: regular rate and rhythm, S1, S2 normal, no murmur, click, rub or gallop Abdomen: post surgery gauze packing on site of incision, approx. 7 cm x 5 cm area covered left inferolateral to umbilicus Extremities: extremities normal, atraumatic, no cyanosis or edema Skin: Skin color, texture, turgor normal. No rashes or lesions Lab  Results: Basic Metabolic Panel:  Lab 09/10/11 2130 09/09/11 1706  NA 140 136  K 4.0 3.9  CL 104 100  CO2 25 27  GLUCOSE 108* 91  BUN 9 7  CREATININE 0.85 0.84  CALCIUM 9.5 9.6  MG -- --  PHOS -- --   Liver Function Tests:  Lab 09/10/11 0610 09/09/11 1706  AST 30 30  ALT 55* 54*  ALKPHOS 88 86  BILITOT 0.3 0.2*  PROT 8.6* 8.9*  ALBUMIN 3.5 3.7   No results found for this basename: LIPASE:2,AMYLASE:2 in the last 168 hours No results found for this basename: AMMONIA:2 in the last 168 hours CBC:  Lab 09/10/11 0610 09/09/11 1706  WBC 5.7 5.5  NEUTROABS -- 1.4*  HGB 16.2 15.7  HCT 45.6 44.2  MCV 87.2 86.8  PLT 369 371   Cardiac Enzymes: No results found for this basename: CKTOTAL:3,CKMB:3,CKMBINDEX:3,TROPONINI:3 in the last 168 hours BNP: No results found for this basename: PROBNP:3 in the last 168 hours D-Dimer: No results found for this basename: DDIMER:2 in the last 168 hours CBG: No results found for this basename: GLUCAP:6 in the last 168 hours Hemoglobin A1C: No results found for this basename: HGBA1C in the last 168 hours Fasting Lipid Panel: No results found for this basename: CHOL,HDL,LDLCALC,TRIG,CHOLHDL,LDLDIRECT in the last 865 hours Thyroid Function Tests: No results found for this basename: TSH,T4TOTAL,FREET4,T3FREE,THYROIDAB in the last 168 hours Coagulation:  Lab 09/09/11 1706  LABPROT 13.7  INR 1.03   Anemia Panel: No results found  for this basename: VITAMINB12,FOLATE,FERRITIN,TIBC,IRON,RETICCTPCT in the last 168 hours Urine Drug Screen: Drugs of Abuse  No results found for this basename: labopia, cocainscrnur, labbenz, amphetmu, thcu, labbarb    Alcohol Level: No results found for this basename: ETH:2 in the last 168 hours Urinalysis: No results found for this basename: COLORURINE:2,APPERANCEUR:2,LABSPEC:2,PHURINE:2,GLUCOSEU:2,HGBUR:2,BILIRUBINUR:2,KETONESUR:2,PROTEINUR:2,UROBILINOGEN:2,NITRITE:2,LEUKOCYTESUR:2 in the last 168  hours  Micro Results: No results found for this or any previous visit (from the past 240 hour(s)). Studies/Results: No results found. Medications:  I have reviewed the patient's current medications. Anti-infectives     Start     Dose/Rate Route Frequency Ordered Stop   09/09/11 1704   clindamycin (CLEOCIN) IVPB 600 mg        600 mg 100 mL/hr over 30 Minutes Intravenous 3 times per day 09/09/11 1656           Scheduled Meds:   . clindamycin (CLEOCIN) IV  600 mg Intravenous Q8H  . clonazePAM  0.5 mg Oral QHS  . docusate sodium  100 mg Oral BID  .  morphine injection  4 mg Intravenous On Call to OR  . sodium chloride  3 mL Intravenous Q12H  . DISCONTD: DULoxetine  60 mg Oral Daily  . DISCONTD: enoxaparin  40 mg Subcutaneous Q24H   Continuous Infusions:  PRN Meds:.sodium chloride, 0.9 % irrigation (POUR BTL), acetaminophen, acetaminophen, alum & mag hydroxide-simeth, morphine injection, promethazine, sodium chloride, DISCONTD:  morphine injection Assessment/Plan:  Abdominal abscess - Patient is now s/p surgical drainage and packing of his abscess. No complications and procedure was tolerated well. Originally patient had not taken doxycycline on d/c from hospital 08/31/11, and presented with abscess where boil was drained. He is admitted for surgical drainage and IV abx therapy. - appreciate CCS consult in the management of this patient  - MRSA positive  - IV clindamycin 600 mg TID, will switch to PO at discharge - tylenol and oxy IR for pain  - NSL - regular diet - zofran prn for nausea  HIV - He has been off HAART for 2 months and prior to that he was not taking his Truvada and Isentress as prescribed. He will be speaking with the ID clinic financial counselor to restart on a new regimen at discharge or soon after. He also has a genotype test pending in the clinic for any resistances he may have developed. -currently, in the hospital we will not start any meds yet as this would  be futile until he has the financial aspect taken care of -future regimen will consist of Truvada, Prezista, and Norvir  Anxiety - Patient reports increased anxiety lately and has a history of anxiety and depression for which Cymbalta failed 2/2 intense nausea.   - continue clonazepam 0.5 mg qhs PRN   DVT ppx - SCD's  Disposition - He will be discharged home today. He will have a follow-up appointment in ID clinic for restarting his meds.   LOS: 1 day   This is a Psychologist, occupational Note.  The care of the patient was discussed with Dr. Quentin Ore and the assessment and plan formulated with their assistance.  Please see their attached note for official documentation of the daily encounter.  Lewie Chamber 09/10/2011, 9:05 AM

## 2011-09-10 NOTE — Anesthesia Preprocedure Evaluation (Addendum)
Anesthesia Evaluation  Patient identified by MRN, date of birth, ID band  Reviewed: Allergy & Precautions, H&P , NPO status   Airway Mallampati: II      Dental   Pulmonary neg pulmonary ROS,  breath sounds clear to auscultation        Cardiovascular negative cardio ROS  Rhythm:Regular Rate:Normal     Neuro/Psych  Headaches,    GI/Hepatic   Endo/Other  negative endocrine ROS  Renal/GU negative Renal ROS     Musculoskeletal   Abdominal   Peds  Hematology negative hematology ROS (+)   Anesthesia Other Findings   Reproductive/Obstetrics                          Anesthesia Physical Anesthesia Plan  ASA: III  Anesthesia Plan: General   Post-op Pain Management:    Induction: Intravenous  Airway Management Planned: Oral ETT  Additional Equipment:   Intra-op Plan:   Post-operative Plan:   Informed Consent: I have reviewed the patients History and Physical, chart, labs and discussed the procedure including the risks, benefits and alternatives for the proposed anesthesia with the patient or authorized representative who has indicated his/her understanding and acceptance.     Plan Discussed with: CRNA  Anesthesia Plan Comments:        Anesthesia Quick Evaluation

## 2011-09-10 NOTE — Progress Notes (Signed)
Resident Co-sign Daily Note: I have seen the patient and reviewed the daily progress note by Lewie Chamber and discussed the care of the patient with them.  See below for documentation of my findings, assessment, and plans.  Subjective: Feeling well post-op in the PACU.  Denies nausea or vomiting. States is having some abdominal pain. Objective: Vital signs in last 24 hours: Filed Vitals:   09/10/11 1030 09/10/11 1039 09/10/11 1045 09/10/11 1051  BP: 127/78  117/70   Pulse: 76 76 82 87  Temp:      TempSrc:      Resp: 15 16 14 15   Height:      Weight:      SpO2: 95% 96% 93% 93%   Physical Exam: General: resting in bed Cardiac: RRR, no rubs, murmurs or gallops Pulm: clear to auscultation bilaterally with anterior auscultation, moving normal volumes of air Abd: soft,nondistended, BS present. Tender to palpation surrounding I&D site. Wound appears clean and is packed with gauze. Neuro: alert and oriented X3, cranial nerves II-XII grossly intact  Lab Results: Reviewed and documented in Electronic Record Micro Results: Reviewed and documented in Electronic Record Studies/Results: Reviewed and documented in Electronic Record Medications: I have reviewed the patient's current medications. Scheduled Meds:   . clindamycin (CLEOCIN) IV  600 mg Intravenous Q8H  . clonazePAM  0.5 mg Oral QHS  . docusate sodium  100 mg Oral BID  . fentaNYL      .  morphine injection  4 mg Intravenous On Call to OR  . naproxen  500 mg Oral BID WC  . sodium chloride  3 mL Intravenous Q12H  . sodium chloride  3 mL Intravenous Q12H  . DISCONTD: DULoxetine  60 mg Oral Daily  . DISCONTD: enoxaparin  40 mg Subcutaneous Q24H  . DISCONTD: naproxen sodium  550 mg Oral BID WC   Continuous Infusions:  PRN Meds:.sodium chloride, sodium chloride, acetaminophen, acetaminophen, alum & mag hydroxide-simeth, diphenhydrAMINE, fentaNYL, ondansetron (ZOFRAN) IV, oxyCODONE, promethazine, sodium chloride, sodium chloride,  DISCONTD: 0.9 % irrigation (POUR BTL), DISCONTD: acetaminophen, DISCONTD: acetaminophen, DISCONTD: fentaNYL, DISCONTD:  morphine injection, DISCONTD:  morphine injection Assessment/Plan: *Abdominal wall abscess - The patient has an MRSA positive abscess that could not be I&D'd in clinic. There was no concern for intra-abdominal process. IV clindamycin (cultures show this is sensitive) was started at admission as the patient has an allergy to Vanc. Patient was I&D in the OR today and is doing well postop. -- Will monitor this afternoon with possible discharge this evening if he is feeling well and pain is controlled. -- tylenol and naproxen for pain  -- SL IV   Active Problems:  HIV INFECTION - Essentially under treated then untreated for the last 5 months. The patient has no meds as an out patient and will need to be re-started in ADAP. We called Dr. Daiva Eves today who will arrange for the patient to get his antiretrovirals as an outpatient. No antiretrovirals were started during this admission.  ANXIETY DEPRESSION - stable. Prescribed clonazepam 0.5mg  PO qhs PRN for anxiety   DVT proph -SCDs   LOS: 1 day   Carmell Elgin 09/10/2011, 1:16 PM

## 2011-09-10 NOTE — Transfer of Care (Signed)
Immediate Anesthesia Transfer of Care Note  Patient: Eduardo Leon  Procedure(s) Performed: Procedure(s) (LRB): IRRIGATION AND DEBRIDEMENT ABSCESS (N/A)  Patient Location: PACU  Anesthesia Type: General  Level of Consciousness: awake, alert , oriented and sedated  Airway & Oxygen Therapy: Patient Spontanous Breathing and Patient connected to nasal cannula oxygen  Post-op Assessment: Report given to PACU RN, Post -op Vital signs reviewed and stable and Patient moving all extremities  Post vital signs: Reviewed and stable  Complications: No apparent anesthesia complications

## 2011-09-10 NOTE — H&P (Signed)
There has been no change in the patient's past medical history or physical exam in the past 24 hours to the best of my knowledge. I examined the patient in the holding area and have made any changes to the history and physical exam report that is included above.   Expectations and outcome results have been discussed with the patient to include risks and benefits.  All questions have been answered and we will proceed with previously discussed procedure noted and signed in the consent form in the patient's record.    Eduardo Leon BMD FACS 8:50 AM  09/10/2011

## 2011-09-10 NOTE — Care Management Note (Signed)
    Page 1 of 1   09/10/2011     3:24:12 PM   CARE MANAGEMENT NOTE 09/10/2011  Patient:  Eduardo Leon, Eduardo Leon   Account Number:  [id number]  Date Initiated:  09/10/2011  Documentation initiated by:  Onnie Boer  Subjective/Objective Assessment:   PT WAS ADMITTED AFTER FAILED OP TREATMENT OF AN ABCESS     Action/Plan:   PROGRESSION OF CARE AND DISCHARGE PLANNING   Anticipated DC Date:  09/12/2011   Anticipated DC Plan:  HOME/SELF CARE      DC Planning Services  CM consult      Choice offered to / List presented to:             Status of service:  Completed, signed off Medicare Important Message given?   (If response is "NO", the following Medicare IM given date fields will be blank) Date Medicare IM given:   Date Additional Medicare IM given:    Discharge Disposition:  HOME/SELF CARE  Per UR Regulation:  Reviewed for med. necessity/level of care/duration of stay  If discussed at Long Length of Stay Meetings, dates discussed:    Comments:  09/10/11 Onnie Boer, RN, BSN 1521 PT IS TO BE DC'D TODAY WITH PO ABX.  MEDICINE IS ON THE $4 LIST AT [facility] AND PT STATED THAT HE CAN AFFORD IT.  PT STATED THAT HE WILL GET RECERTIFICATION AT DR. Tommas Olp DAMS OFFICE FOR HIS OTHER MEDS.  09/10/11 Onnie Boer, RN, BSN 1248 PT WAS ADMITTED FOR I&D OF ABCESS.  PTA PT WAS AT HOME WITH SELF CARE.  PT NEEDS MED ASSISTANCE AT DC.  WILL F/U ON OTHER DC NEEDS.

## 2011-09-10 NOTE — Op Note (Signed)
Surgeon: Wenda Low, MD, FACS  Asst:  none  Anes:  general  Procedure: Excision of prior drainage site with drainage of infraumbilical abdominal wall abscess  Diagnosis: Probable MRSA abscess  Complications: none  EBL:   2 cc  Description of Procedure:  The patient was taken to OR 17 where general anesthesia was administered and a time out performed.  The incompletely drained abscess was on his left infraumbilical region.  After PCMX prep, the old incision was excised.  The indurated area was probed and opened laterally.  Hemostasis was achieved and the wound was packed with 1/2 " iodophor.    Matt B. Daphine Deutscher, MD, [facility], [place] [phone]

## 2011-09-10 NOTE — Progress Notes (Signed)
Pt D/C'd home @ 1645 per MD order. Pt given D/C instructions with Rx's with verbal understanding. Rema Fendt, RN

## 2011-09-10 NOTE — Discharge Instructions (Signed)
You were hospitalized for an abdominal abscess that developed on your middle to lower left abdomen. This abscess formed from the boil you had drained previously at [facility] and due to poor antibiotic control it grew to an abscess. For this you were treated surgically in the operating room at [facility] after an attempt in your ID clinic failed due to pain and discomfort with the procedure. The surgery was successful and there were no complications and your abscess was drained and packed with sterile gauze appropriately. We will have you come to the ID clinic for a follow-up appointment for restarting your HIV therapy and beginning a new regimen. A financial counselor from the clinic will be getting in touch with you also if this doesn't already happen before you leave the hospital.  If you do not hear from the ID clinic after a couple days or you do not have a follow-up appointment at discharge, please call them immediately. [phone]  #1) Abdominal abscess - You abscess was treated successfully. It is important to take your antibiotic completely until finished after discharge in order to help this wound heal.  #2) HIV - You will be returning to the ID clinic very soon and will begin a new HIV regimen. It is very important that you come to this appointment and contact the ID department if you do not already have an appointment at discharge.  AIDS AIDS (Acquired Immune Deficiency Syndrome) is a severe viral infection caused by the Human Immunodeficiency Virus (HIV). This virus destroys a person's resistance to disease and certain cancers. It is transmitted through blood, blood products, and body fluids. Although the fear of AIDS has grown faster than the epidemic, it is important to note that AIDS is not spread by casual contact and is easily killed by hot water, soap, bleach, and most antiseptics. HOW THE AIDS INFECTION WORKS Once HIV enters the body it affects T-helper (T-4) lymphocytes.  These are white blood cells that are crucial for the immune system. Once they become infected, they become factories for producing the AIDS Virus. Eventually these infected T-4 cells die. This leaves the victim susceptible to infection and certain cancers. While anyone can get AIDS, you are unlikely to get this disease unless you indulge in high-risk behavior. Some of these high-risk behaviors are promiscuous sex, having close relationships with HIV-positive people, and the sharing of needles. This illness was initially more common in homosexual men, but as time progresses, it will most probably affect an equal number of men and women. Babies born to women who are infected, have greater chances of getting AIDS. Infection with HIV may cause a brief, mild illness with fever several weeks after contact. More serious symptoms do not develop until months or years later, so a person can be infected with the virus without showing any symptoms at all. At this stage of the infection there is no way of knowing you are infected unless you have a blood or mouth scraping test for the AIDS virus. SYMPTOMS   Fevers, night sweats, general weakness, enlarged lymph nodes.   Chest pain, pneumonia, chronic cough, shortness of breath.   Weight loss, diarrhea, difficulty swallowing, rectal problems.   Headaches, personality changes, problems with vision and memory.   Skin tumors (patchy dark areas) and infections.  There is no cure or vaccine for AIDS at the present time. Anti-viral antibiotic drugs have been shown to stop the virus from multiplying, which helps prolong health. The best treatment  against AIDS is prevention. If you have been infected with HIV, careful medical follow-up with regular blood tests is necessary.  Do not take part in risky behaviors. These include sharing needles and syringes or other sharp instruments like razors with others, having unprotected sex with high-risk people (homosexuals, bisexuals,  or prostitutes), and engaging in anal sex (with or without a condom).  For further information about AIDS, please call your caregiver, the health department, or the Center for Disease Control: 800-342-AIDS. MISCONCEPTIONS ABOUT AIDS  You can not get AIDS through casual contact. This includes sitting next to an AIDS infected person, being coughed on, living with, swimming with, eating food prepared by, sitting or lying next to someone with AIDS.   It is not caught from toilet seats, from showers, bath tubs, water fountains, phones, drinking glasses, or food touched or used by people with AIDS. Casual kissing will probably not transmit the disease. "[place] kissing" (putting one's tongue in another's mouth) is probably not a good idea as the AIDS Virus is present in saliva.   You will not get AIDS by donating blood. The needles used by blood banks are sterile and disposable.   There is no evidence that AIDS is transmitted through tears.   AIDS is not caught through mosquitoes.   Children with AIDS will not pass AIDS to other children in school without exchange of blood products or engaging in sex. In school, a child with AIDS is actually at greater risk because of their weak immune status. Their susceptibility to the viruses and germs (bacteria) carried by children without AIDS is great.  TESTING FOR AIDS  An ELISA (enzyme-linked immunoabsorbent assay) is a blood test available to let you know if you have contracted AIDS. If this test is positive, it is usually repeated.   If positive a second time, a second test known as the Western blot test is performed. If the Western blot test is positive, it means you have been infected with HIV.  PREVENTION  The best way to prevent AIDS is to avoid high-risk behavior.   The outlook for defeating AIDS is good. Millions of research dollars are being spent on creating a vaccine to prevent the disease as well as providing a cure. New drugs appear to be  extremely effective at controlling the disease.   Your caregiver will educate you in all the most effective and current treatments.  Document Released: 04/04/2000 Document Revised: 03/27/2011 Document Reviewed: 03/31/2008 [employer] Patient Information 2012 [place], [place].

## 2011-09-11 ENCOUNTER — Encounter (HOSPITAL_COMMUNITY): Payer: Self-pay | Admitting: Surgery

## 2011-09-11 NOTE — Discharge Summary (Signed)
Internal Medicine Teaching [facility] Discharge Note  Name: Eduardo Leon MRN: [id number] DOB: 06/09/1985 26 y.o.  Date of Admission: 09/09/2011  1:27 PM Date of Discharge: 09/11/2011 Attending Physician: Dr. Mariana Arn  Discharge Diagnosis: Principal Problem:  *Abdominal wall abscess Active Problems:  HIV INFECTION  ANXIETY DEPRESSION  Depression   Discharge Medications: Medication List  As of 09/11/2011  1:49 PM   STOP taking these medications         cetirizine 10 MG tablet      Darunavir Ethanolate 800 MG tablet      doxycycline 100 MG tablet      DULoxetine 60 MG capsule      emtricitabine-tenofovir 200-300 MG per tablet      promethazine 25 MG tablet      ritonavir 100 MG Tabs         TAKE these medications         acetaminophen 325 MG tablet   Commonly known as: TYLENOL   Take 2 tablets (650 mg total) by mouth every 4 (four) hours as needed (or Fever >/= 101).      clonazePAM 0.5 MG tablet   Commonly known as: KLONOPIN   Take 0.5 mg by mouth at bedtime as needed.      oxyCODONE-acetaminophen 5-325 MG per tablet   Commonly known as: PERCOCET   Take 1 tablet by mouth every 6 (six) hours as needed for pain.      sulfamethoxazole-trimethoprim 800-160 MG per tablet   Commonly known as: BACTRIM DS,SEPTRA DS   Take 1 tablet by mouth 2 (two) times daily. For 10 days            Disposition and follow-up:   Eduardo Leon was discharged from [facility] in Good condition. He had successful I and D. of his abdominal wall abscess and tolerated this procedure well. He recovered without complication and was able to be discharged on the day of surgery. He will followup in the ID clinic.   FOLLOW UP APPOINTMENTS Follow-up Information    Follow up with Acey Lav, MD. Schedule an appointment as soon as possible for a visit on 09/12/2011. (8:45am)    Contact information:   [address] [place] [address] [phone]         Follow-up Appointments: Discharge Orders    Future Appointments: Provider: Department: Dept Phone: Center:   09/12/2011 8:45 AM Randall Hiss, MD [facility] [phone] [facility]     Future Orders Please Complete By Expires   Diet - low sodium heart healthy      Discharge instructions      Comments:   1. Please take your antibiotics for 10 days 2. Please make an appointment with the ID clinic for some time next week 3. Please wait for your appointment with ID clinic to restart HIV medicines 4. Please use tylenol and percocet as needed for pain. 4. Call [facility] for fevers, redness, swelling, nausea, vomiting, or other concerning symptoms in relation to your drained abscess.   Increase activity slowly      Discharge instructions      Comments:   1. Please take antibiotics in full 2. Please go to ID clinic appointment on Friday for removal of packing. 3. Please use tylenol or percocet for pain 4. Please do not take any HIV meds until Friday's appointment 5. Keep wound clean and dry, do not take any baths.  Cover with plastic in shower.  Use dry dressing.   Call MD for:  temperature >100.4      Call MD for:  persistant nausea and vomiting      Call MD for:  severe uncontrolled pain        At his followup appointment with Dr. Daiva Eves, The following item should be addressed: #1 please remove packing from abdominal incision. The patient should then be instructed as to how to apply dry dressings to his abdominal wound. Dr. Daiva Eves will use his discretion as to if additional packing is required.  #2 please ensure the patient continues to reestablish ADAP coverage.  #3 please ensure the patient actually filled and is taking his prescription for Bactrim   Consultations: Treatment Team:  Md Ccs, MD  Procedures Performed:  09/10/2011 Excision of prior drainage site with drainage of infraumbilical abdominal wall abscess was performed by  Dr. Wenda Low   Admission HPI: Eduardo Leon is a 26 y.o.male with past medical history significant for HIV (last CD4 on 5/12 was 380) who presents with abdominal wall pain and swelling for about 2 weeks.  Eduardo Leon states that on May 9, he developed an ingrown hair on his left mid abdomen that progressed into a boil. He had a feeling of malaize and subjective fevers as well as increasing local pain. He presented to [facility] on May 11 and the abscess was drained in the ED. He was then transferred to [facility] where he was admitted to [facility] for IV antibiotic therapy. He stayed 1 night and was dicharged on the 12th with a 10 day course of doxycycline. He could not afford the doxycycline, but did fill the prescription for hydrocodone. However he only took 2 days due to itching. He presented to the ID clinic today for hospital follow-up and Dr. Daiva Eves attempted to drain the abscess again. This was unsuccessful as the pain would not tolerate the procedure secondary to pain. Dr. Daiva Eves called for inpatient admission and surgical consult to I&D abscess. Currently the patient states that the pain is an occasional, sharp pain in his anterior left abdominal wall just inferolateral to the umbilicus without radiation. He is currently not in any pain, but states that the pain is worse with manipulation. The pain is better with rest, change of position and hydrocodone. At its maximum, during his attempted I&D today the pain was very severe  He has not been taking any anti-retrovirals for 2-3 months because he could not arrange follow-up. He was "streatching out" hs existing prescription for a couple of months before he ran out. He is on the ADAP program.   Admission Physical Exam:  Blood pressure 116/75, pulse 72, temperature 97.5 F (36.4 C), temperature source Oral, resp. rate 18, SpO2 100.00%.  Gen: Well-developed, well-nourished male in no acute distress; alert, appropriate and cooperative throughout  examination. Head: Normocephalic, atraumatic. Eyes: PERRL, EOMI, No signs of anemia or jaundince.  Nose: Mucous membranes moist, not inflammed, nonerythematous. Throat: Oropharynx nonerythematous, no exudate appreciated.  Neck: Supple with no deformities, masses, or tenderness noted.  Lungs: Normal respiratory effort. Clear to auscultation BL, without crackles or wheezes. Heart: RRR. S1 and S2 normal without murmur, gallop,or rubs. Abdomen: BS normoactive. Soft, nondistended, non-tender. No masses or organomegaly. There is a 4x6 cm indurated swollen area just inferolateral to the umbilicus. There is a prior I&D incision that is 1 cm in size.  Extremities: No pretibial edema. Neurologic: A&O X3, CN II -  XII are grossly intact. Motor strength is 5/5 in the all 4 extremities, Sensations intact to light touch. No focal neurologic deficit Skin: No visible rashes, scars.  Psych: mood and affect are normal.    Hospital Course by problem list: Principal Problem:  *Abdominal wall abscess - the patient had a MRSA-positive abscess that could not be successfully incision and drained in clinic. He was admitted from clinic so that we could consult surgery and he could have his abscess drained. He was started on IV clindamycin as the patient has an allergy to vancomycin. There is no concern for intra-abdominal process. The patient was taken to the OR on 09/10/2010 where his abscess was excised by Dr. Daphine Deutscher. The patient did very well with no complications and tolerated the procedure well. He was discharged to home after his surgery. He was discharged with a followup appointment in the ID clinic for him to receive his postop wound care and have his packing changed.   Active Problems:  HIV INFECTION - Essentially the patient had been under treated then untreated for the last 5 months when he was lost to followup in the ID clinic. The patient currently has no meds as an out patient and will need to be re-started  in ADAP. No antiretrovirals were started during this admission. He will follow-up in the ID clinic to reestablish care. A financial counselor from the clinic will be speaking with him and he will be starting a new regimen that will be easier for him to adhere to (e.g. once daily dosing for his meds vs. BID).   ANXIETY DEPRESSION - Patient has a history of depression s/p HIV diagnosis and anxiety. He was tried on duloxetine in the past with a bad reaction of intense nausea causing him to stop taking it. Over time he has been trying to just tolerate the anxiety and depression, but he does admit to wanting to try something new that may help. He was given clonazepam 0.5 mg at bedtime the night of admission and tolerated it well. He will be given a prescription at discharge and follow-up for this in ID clinic.   Discharge Vitals:  BP 120/76  Pulse 82  Temp(Src) 98.2 F (36.8 C) (Oral)  Resp 18  Ht 5\' 5"  (1.651 m)  Wt 162 lb (73.483 kg)  BMI 26.96 kg/m2  SpO2 98%  Discharge Labs: Lab Results  Component Value Date   WBC 5.7 09/10/2011   HGB 16.2 09/10/2011   HCT 45.6 09/10/2011   MCV 87.2 09/10/2011   PLT 369 09/10/2011   Lab Results  Component Value Date   CREATININE 0.85 09/10/2011   BUN 9 09/10/2011   NA 140 09/10/2011   K 4.0 09/10/2011   CL 104 09/10/2011   CO2 25 09/10/2011   Lab Results  Component Value Date   INR 1.03 09/09/2011    Time spent in discharge (includes decision making & examination of pt): 40 minutes.   Signed: Tristen Luce 09/11/2011, 1:49 PM

## 2011-09-12 ENCOUNTER — Ambulatory Visit (INDEPENDENT_AMBULATORY_CARE_PROVIDER_SITE_OTHER): Payer: Self-pay | Admitting: Infectious Disease

## 2011-09-12 ENCOUNTER — Encounter: Payer: Self-pay | Admitting: Infectious Disease

## 2011-09-12 VITALS — BP 145/95 | HR 75 | Temp 97.9°F | Wt 164.0 lb

## 2011-09-12 DIAGNOSIS — B20 Human immunodeficiency virus [HIV] disease: Secondary | ICD-10-CM

## 2011-09-12 DIAGNOSIS — L03319 Cellulitis of trunk, unspecified: Secondary | ICD-10-CM

## 2011-09-12 DIAGNOSIS — L02211 Cutaneous abscess of abdominal wall: Secondary | ICD-10-CM

## 2011-09-12 DIAGNOSIS — L02219 Cutaneous abscess of trunk, unspecified: Secondary | ICD-10-CM

## 2011-09-12 DIAGNOSIS — A4902 Methicillin resistant Staphylococcus aureus infection, unspecified site: Secondary | ICD-10-CM

## 2011-09-12 MED ORDER — DARUNAVIR ETHANOLATE 800 MG PO TABS: 800.0000 mg | ORAL_TABLET | Freq: Every day | ORAL | Status: DC

## 2011-09-12 MED ORDER — SULFAMETHOXAZOLE-TRIMETHOPRIM 800-160 MG PO TABS: 2.0000 | ORAL_TABLET | Freq: Two times a day (BID) | ORAL | Status: AC

## 2011-09-12 MED ORDER — EMTRICITABINE-TENOFOVIR DF 200-300 MG PO TABS: 1.0000 | ORAL_TABLET | Freq: Every day | ORAL | Status: DC

## 2011-09-12 MED ORDER — OXYCODONE-ACETAMINOPHEN 5-325 MG PO TABS: 1.0000 | ORAL_TABLET | Freq: Four times a day (QID) | ORAL | Status: AC | PRN

## 2011-09-12 MED ORDER — RITONAVIR 100 MG PO TABS: 100.0000 mg | ORAL_TABLET | Freq: Every day | ORAL | Status: DC

## 2011-09-12 NOTE — Assessment & Plan Note (Signed)
Sp I and D and on doxy, will change to bactrim DS two tablets to finish 2 weeks, fu in [facility] to make sure wound is healing, pack daily

## 2011-09-12 NOTE — Assessment & Plan Note (Signed)
Start prezista, norvir truvada, recheck labs in one month

## 2011-09-12 NOTE — Patient Instructions (Signed)
Please take two bactrims (new antibiotic) twice daily for 2 weeks  Please meet with Tammy to get your ADAP application filled out  Your new HIV regimen is:  prezista 800mg  (orange red pill)  norvir 100mg  (white pill)  And  truvada (blue pills)  All taken once daily  Continue to pack wound once daily during this time  Make appt with ID clinic MD in 2 weeks  Make appt for labs in 4 weeks  Make appt with Dr Daiva Eves or ID clinic MD in 6 weeks

## 2011-09-12 NOTE — Progress Notes (Signed)
Subjective:    Patient ID: Eduardo Leon, male    DOB: [birth year], 26 y.o.   MRN: [id number]  HPI  25 year old [place] American man with HIV previously well controlled on isentress and truvada but recently noncompliant. He ran out of meds, "never received phone call back:" and then tried to stretch his meds out" He was recently admitted to the B service for soft tissue abscess with MRSA sp I and D in HPR placed on vanco/zosyn x 3 days then dc with doxycycline. He never filled the doxy because it plus the narcotic he was rx cost too"much money. I saw him on Wednesday attempted I and D and then admitted him tot he B servcie where he was graciously seen by CCS who performed I and D of abscess and he was treated with IV antibiotics and dc on oral doxycyline. HE is having some diarrhea with doxy. He has not had wound unpacked yet. We removed dressing and blood soaked iodoform dressing. Re-applied iodoform dressing. He was supposed to meet with [facility] today to get ADAP application goinbg but brought NONE of the requisite forms. We had him meet with them anyway to try to make sure he gets this done ASAP. I spent greater than 45 minutes with the patient including greater than 50% of time in face to face counsel of the patient and in coordination of their care.   Review of Systems  Constitutional: Negative for fever, chills, diaphoresis, activity change, appetite change, fatigue and unexpected weight change.  HENT: Negative for congestion, sore throat, rhinorrhea, sneezing, trouble swallowing and sinus pressure.   Eyes: Negative for photophobia and visual disturbance.  Respiratory: Negative for cough, chest tightness, shortness of breath, wheezing and stridor.   Cardiovascular: Negative for chest pain, palpitations and leg swelling.  Gastrointestinal: Positive for abdominal pain. Negative for nausea, vomiting, diarrhea, constipation, blood in stool, abdominal distention and anal bleeding.  Genitourinary:  Negative for dysuria, hematuria, flank pain and difficulty urinating.  Musculoskeletal: Negative for myalgias, back pain, joint swelling, arthralgias and gait problem.  Skin: Positive for wound. Negative for color change, pallor and rash.  Neurological: Negative for dizziness, tremors, weakness and light-headedness.  Hematological: Negative for adenopathy. Does not bruise/bleed easily.  Psychiatric/Behavioral: Negative for behavioral problems, confusion, sleep disturbance, dysphoric mood, decreased concentration and agitation.       Objective:   Physical Exam  Constitutional: He is oriented to person, place, and time. He appears well-developed and well-nourished. No distress.  HENT:  Head: Normocephalic and atraumatic.  Mouth/Throat: Oropharynx is clear and moist. No oropharyngeal exudate.  Eyes: Conjunctivae and EOM are normal. Pupils are equal, round, and reactive to light. No scleral icterus.  Neck: Normal range of motion. Neck supple. No JVD present.  Cardiovascular: Normal rate, regular rhythm and normal heart sounds.  Exam reveals no gallop and no friction rub.   No murmur heard. Pulmonary/Chest: Effort normal and breath sounds normal. No respiratory distress. He has no wheezes. He has no rales. He exhibits no tenderness.  Abdominal: He exhibits no distension and no mass. There is tenderness. There is no rebound and no guarding.    Musculoskeletal: He exhibits no edema and no tenderness.  Lymphadenopathy:    He has no cervical adenopathy.  Neurological: He is alert and oriented to person, place, and time. He has normal reflexes. He exhibits normal muscle tone. Coordination normal.  Skin: Skin is warm and dry. He is not diaphoretic. No erythema. No pallor.  Psychiatric: He has a  normal mood and affect. His behavior is normal. Judgment and thought content normal.          Assessment & Plan:  HIV INFECTION Start prezista, norvir truvada, recheck labs in one month  Abdominal  wall abscess Sp I and D and on doxy, will change to bactrim DS two tablets to finish 2 weeks, fu in [facility] to make sure wound is healing, pack daily  MRSA infection See above, will do a decolonization

## 2011-09-12 NOTE — Assessment & Plan Note (Signed)
See above, will do a decolonization

## 2011-09-17 ENCOUNTER — Telehealth: Payer: Self-pay | Admitting: *Deleted

## 2011-09-17 DIAGNOSIS — L03319 Cellulitis of trunk, unspecified: Secondary | ICD-10-CM

## 2011-09-17 LAB — HIV-1 GENOTYPR PLUS

## 2011-09-17 MED ORDER — DOXYCYCLINE HYCLATE 100 MG PO TABS: 100.0000 mg | ORAL_TABLET | Freq: Two times a day (BID) | ORAL | Status: AC

## 2011-09-17 NOTE — Telephone Encounter (Signed)
Patient came to clinic today (no appt) due to intense itching. Not sure which new medication is causing it. After speaking with the provider, Dr Drue Second, instructed to change the patient medication and give him Doxy 100 mg 1 tabs twice a day for 5 days. And stop the Roxicet and use OTC Tylenol or Advil as directed.

## 2011-09-18 ENCOUNTER — Ambulatory Visit (INDEPENDENT_AMBULATORY_CARE_PROVIDER_SITE_OTHER): Payer: Self-pay | Admitting: Internal Medicine

## 2011-09-18 ENCOUNTER — Encounter: Payer: Self-pay | Admitting: Internal Medicine

## 2011-09-18 VITALS — BP 133/81 | HR 87 | Temp 98.3°F | Ht 65.0 in | Wt 163.0 lb

## 2011-09-18 DIAGNOSIS — L02211 Cutaneous abscess of abdominal wall: Secondary | ICD-10-CM

## 2011-09-18 DIAGNOSIS — L02219 Cutaneous abscess of trunk, unspecified: Secondary | ICD-10-CM

## 2011-09-18 MED ORDER — TRAZODONE HCL 50 MG PO TABS: 50.0000 mg | ORAL_TABLET | Freq: Every day | ORAL | Status: DC

## 2011-09-18 NOTE — Progress Notes (Signed)
  Subjective:    Patient ID: Eduardo Leon, male    DOB: 1986-04-05, 26 y.o.   MRN: [id number]  HPI He comes in for check of his wound. He recently had an incision and drainage in the emergency room and now has packing. The mother of his partner does change the packing and is a Engineer, civil (consulting). He was seen about 2 days ago and was doing well. He's had no new pain, no fever chills or new issues.   Review of Systems  Constitutional: Negative.   Gastrointestinal: Negative for diarrhea.  Skin: Negative for rash.       Objective:   Physical Exam  Abdominal:       Incision is without surrounding erythema, is soft and with good tissue and no drainage noted. It is packed and doing well.          Assessment & Plan:

## 2011-09-18 NOTE — Assessment & Plan Note (Signed)
He is doing well and will continue to keep the packing in as it heals. He knows that it will heal from the inside out. He will finish his course of antibiotics. He will return for this on a p.r.n. Basis. He is returning for HIV followup soon.

## 2011-09-22 ENCOUNTER — Telehealth: Payer: Self-pay | Admitting: Licensed Clinical Social Worker

## 2011-09-22 ENCOUNTER — Telehealth: Payer: Self-pay

## 2011-09-22 ENCOUNTER — Other Ambulatory Visit: Payer: Self-pay | Admitting: Licensed Clinical Social Worker

## 2011-09-22 DIAGNOSIS — B20 Human immunodeficiency virus [HIV] disease: Secondary | ICD-10-CM

## 2011-09-22 NOTE — Telephone Encounter (Signed)
Patient did not have a RPR or GC added to his lab work. I called the patient and left a message for him to make a lab appointment because there were two labs that were not added that we needed him to have. I will add the orders in [facility]

## 2011-09-22 NOTE — Telephone Encounter (Signed)
Called patient and left message to call me back for enrollment in ADAP/RW/gccn - Amy Faw said he had intake appt with her and didn't show - let Jamie H. know.

## 2011-09-23 NOTE — Telephone Encounter (Signed)
Thanks Tamika. 

## 2011-09-24 ENCOUNTER — Other Ambulatory Visit (INDEPENDENT_AMBULATORY_CARE_PROVIDER_SITE_OTHER): Payer: Self-pay

## 2011-09-24 DIAGNOSIS — Z113 Encounter for screening for infections with a predominantly sexual mode of transmission: Secondary | ICD-10-CM

## 2011-09-24 DIAGNOSIS — B20 Human immunodeficiency virus [HIV] disease: Secondary | ICD-10-CM

## 2011-09-25 LAB — RPR TITER: RPR Titer: 1:2 {titer}

## 2011-09-26 ENCOUNTER — Telehealth: Payer: Self-pay | Admitting: Infectious Disease

## 2011-09-26 NOTE — Telephone Encounter (Signed)
Eduardo Leon we need to give Eduardo Leon 3 shots of IM penicillin for syphillis

## 2011-09-29 NOTE — Telephone Encounter (Signed)
Spoke with the patient and he is coming in tomorrow at [employer]

## 2011-09-29 NOTE — Telephone Encounter (Signed)
Perfect

## 2011-09-30 ENCOUNTER — Ambulatory Visit (INDEPENDENT_AMBULATORY_CARE_PROVIDER_SITE_OTHER): Payer: Self-pay | Admitting: *Deleted

## 2011-09-30 DIAGNOSIS — A539 Syphilis, unspecified: Secondary | ICD-10-CM

## 2011-09-30 MED ORDER — PENICILLIN G BENZATHINE 1200000 UNIT/2ML IM SUSP
1.2000 10*6.[IU] | Freq: Once | INTRAMUSCULAR | Status: AC
  Administered 2011-09-30: 1.2 10*6.[IU] via INTRAMUSCULAR

## 2011-09-30 MED ORDER — PENICILLIN G BENZATHINE 1200000 UNIT/2ML IM SUSP: 1.2000 10*6.[IU] | Freq: Once | INTRAMUSCULAR | Status: DC

## 2011-09-30 NOTE — Progress Notes (Signed)
Reviewed pt's medication allergies.  Identified pt with full name and date of birth.  Explained the reason for the injections, how they would be administered and that the pt would be returning the next two weeks to complete the series of 3 weekly injections.  Pt verbalized understanding.  Pt verbalized that he had be notified by a previous partner of the possibility of being exposed.  Pt verbalized that he has "some type of sore" on his penis a number of months ago but thought he had an allergy to condoms.  RN gave the pt condoms today.  Pt tolerated the injections with minimal complaints.  Pt agreed to the next 2 weeks of injections.  He made next weeks appt on the way out.

## 2011-09-30 NOTE — Patient Instructions (Signed)
Pt advised to keep moving and exercising today to increase absorption of the medication.  RN advised pt to place warm pack to upper buttocks tonight to increase absorption of the medication.

## 2011-10-07 ENCOUNTER — Ambulatory Visit (INDEPENDENT_AMBULATORY_CARE_PROVIDER_SITE_OTHER): Payer: Self-pay | Admitting: Licensed Clinical Social Worker

## 2011-10-07 DIAGNOSIS — B20 Human immunodeficiency virus [HIV] disease: Secondary | ICD-10-CM

## 2011-10-07 DIAGNOSIS — A539 Syphilis, unspecified: Secondary | ICD-10-CM

## 2011-10-07 MED ORDER — PENICILLIN G BENZATHINE 1200000 UNIT/2ML IM SUSP
1.2000 10*6.[IU] | Freq: Once | INTRAMUSCULAR | Status: AC
  Administered 2011-10-07: 1.2 10*6.[IU] via INTRAMUSCULAR

## 2011-10-10 ENCOUNTER — Other Ambulatory Visit: Payer: Self-pay | Admitting: Licensed Clinical Social Worker

## 2011-10-10 DIAGNOSIS — B20 Human immunodeficiency virus [HIV] disease: Secondary | ICD-10-CM

## 2011-10-10 MED ORDER — RITONAVIR 100 MG PO TABS: 100.0000 mg | ORAL_TABLET | Freq: Every day | ORAL | Status: DC

## 2011-10-10 MED ORDER — EMTRICITABINE-TENOFOVIR DF 200-300 MG PO TABS: 1.0000 | ORAL_TABLET | Freq: Every day | ORAL | Status: DC

## 2011-10-10 MED ORDER — DARUNAVIR ETHANOLATE 800 MG PO TABS: 800.0000 mg | ORAL_TABLET | Freq: Every day | ORAL | Status: DC

## 2011-10-14 ENCOUNTER — Ambulatory Visit: Payer: Self-pay

## 2011-10-15 ENCOUNTER — Ambulatory Visit (INDEPENDENT_AMBULATORY_CARE_PROVIDER_SITE_OTHER): Payer: Self-pay | Admitting: Licensed Clinical Social Worker

## 2011-10-15 DIAGNOSIS — A539 Syphilis, unspecified: Secondary | ICD-10-CM

## 2011-10-15 MED ORDER — PENICILLIN G BENZATHINE 1200000 UNIT/2ML IM SUSP
1.2000 10*6.[IU] | Freq: Once | INTRAMUSCULAR | Status: AC
  Administered 2011-10-15: 1.2 10*6.[IU] via INTRAMUSCULAR

## 2011-11-18 ENCOUNTER — Ambulatory Visit: Payer: Self-pay | Admitting: Internal Medicine

## 2011-11-18 ENCOUNTER — Telehealth: Payer: Self-pay | Admitting: *Deleted

## 2011-11-18 NOTE — Telephone Encounter (Signed)
Called and left patient a voice mail, he needs to schedule an appointment with Lesle Reek to apply for ADAP.  Once he does this he needs a lab appt and then f/u with MD. Wendall Mola CMA

## 2012-01-15 ENCOUNTER — Other Ambulatory Visit: Payer: Self-pay | Admitting: Licensed Clinical Social Worker

## 2012-01-15 ENCOUNTER — Other Ambulatory Visit (INDEPENDENT_AMBULATORY_CARE_PROVIDER_SITE_OTHER): Payer: Self-pay

## 2012-01-15 DIAGNOSIS — B20 Human immunodeficiency virus [HIV] disease: Secondary | ICD-10-CM

## 2012-01-15 DIAGNOSIS — Z113 Encounter for screening for infections with a predominantly sexual mode of transmission: Secondary | ICD-10-CM

## 2012-01-15 LAB — CBC WITH DIFFERENTIAL/PLATELET
Basophils Absolute: 0 10*3/uL (ref 0.0–0.1)
Eosinophils Absolute: 0 10*3/uL (ref 0.0–0.7)
Eosinophils Relative: 0 % (ref 0–5)
HCT: 47.6 % (ref 39.0–52.0)
Lymphocytes Relative: 68 % — ABNORMAL HIGH (ref 12–46)
Lymphs Abs: 3.9 10*3/uL (ref 0.7–4.0)
MCH: 30.3 pg (ref 26.0–34.0)
MCV: 85.3 fL (ref 78.0–100.0)
Monocytes Absolute: 0.6 10*3/uL (ref 0.1–1.0)
Platelets: 329 10*3/uL (ref 150–400)
RDW: 13.2 % (ref 11.5–15.5)
WBC: 5.7 10*3/uL (ref 4.0–10.5)

## 2012-01-16 LAB — COMPREHENSIVE METABOLIC PANEL
Albumin: 4.7 g/dL (ref 3.5–5.2)
BUN: 11 mg/dL (ref 6–23)
CO2: 30 mEq/L (ref 19–32)
Glucose, Bld: 88 mg/dL (ref 70–99)
Potassium: 4 mEq/L (ref 3.5–5.3)
Sodium: 137 mEq/L (ref 135–145)
Total Protein: 8.4 g/dL — ABNORMAL HIGH (ref 6.0–8.3)

## 2012-01-16 LAB — RPR

## 2012-01-16 LAB — T-HELPER CELL (CD4) - (RCID CLINIC ONLY)
CD4 % Helper T Cell: 17 % — ABNORMAL LOW (ref 33–55)
CD4 T Cell Abs: 680 uL (ref 400–2700)

## 2012-01-22 ENCOUNTER — Ambulatory Visit: Payer: Self-pay

## 2012-01-27 ENCOUNTER — Ambulatory Visit (INDEPENDENT_AMBULATORY_CARE_PROVIDER_SITE_OTHER): Payer: Self-pay | Admitting: Infectious Disease

## 2012-01-27 ENCOUNTER — Encounter: Payer: Self-pay | Admitting: Infectious Disease

## 2012-01-27 VITALS — BP 135/80 | HR 67 | Temp 97.9°F | Wt 162.0 lb

## 2012-01-27 DIAGNOSIS — B20 Human immunodeficiency virus [HIV] disease: Secondary | ICD-10-CM

## 2012-01-27 DIAGNOSIS — Z23 Encounter for immunization: Secondary | ICD-10-CM

## 2012-01-27 DIAGNOSIS — G43909 Migraine, unspecified, not intractable, without status migrainosus: Secondary | ICD-10-CM

## 2012-01-27 MED ORDER — PROPRANOLOL HCL 10 MG PO TABS
10.0000 mg | ORAL_TABLET | Freq: Two times a day (BID) | ORAL | Status: DC
Start: 2012-01-27 — End: 2012-08-30

## 2012-01-27 MED ORDER — BUTALBITAL-APAP-CAFFEINE 50-325-40 MG PO TABS: 1.0000 | ORAL_TABLET | ORAL | Status: DC | PRN

## 2012-01-27 NOTE — Progress Notes (Signed)
  Subjective:    Patient ID: Eduardo Leon, male    DOB: 1985/08/04, 26 y.o.   MRN: [id number]  HPI  26 year old [place] American man with HIV previously well controlled on isentress and truvada but  Then noncompliant. His HIV genotype and HIV integrase genotype were without resistance. He was changed to prezista norvir and truvada with excellent control.Marland Kitchen He is now about out of meds havnig only recently renewed ADAP a week ago. We gave him samples here in clinic to tide him over for one month while ADAP is readied.  He is having diarrhea 4x per day. We advsed imodium for this   I told him we could consider change to tivicay in future with truvada but to keep current regimen for now.   continues to have migrainous headaches in his therapy for them at this point time.  I spent greater than 45 minutes with the patient including greater than 50% of time in face to face counsel of the patient and in coordination of their care.    Review of Systems  Constitutional: Negative for fever, chills, diaphoresis, activity change, appetite change, fatigue and unexpected weight change.  HENT: Negative for congestion, sore throat, rhinorrhea, sneezing, trouble swallowing and sinus pressure.   Eyes: Negative for photophobia and visual disturbance.  Respiratory: Negative for cough, chest tightness, shortness of breath, wheezing and stridor.   Cardiovascular: Negative for chest pain, palpitations and leg swelling.  Gastrointestinal: Negative for nausea, vomiting, abdominal pain, diarrhea, constipation, blood in stool, abdominal distention and anal bleeding.  Genitourinary: Negative for dysuria, hematuria, flank pain and difficulty urinating.  Musculoskeletal: Negative for myalgias, back pain, joint swelling, arthralgias and gait problem.  Skin: Negative for color change, pallor, rash and wound.  Neurological: Positive for headaches. Negative for dizziness, tremors, weakness and light-headedness.    Hematological: Negative for adenopathy. Does not bruise/bleed easily.  Psychiatric/Behavioral: Negative for behavioral problems, confusion, disturbed wake/sleep cycle, dysphoric mood, decreased concentration and agitation.       Objective:   Physical Exam  Constitutional: He is oriented to person, place, and time. He appears well-developed and well-nourished. No distress.  HENT:  Head: Normocephalic and atraumatic.  Mouth/Throat: Oropharynx is clear and moist. No oropharyngeal exudate.  Eyes: Conjunctivae normal and EOM are normal. Pupils are equal, round, and reactive to light. No scleral icterus.  Neck: Normal range of motion. Neck supple. No JVD present.  Cardiovascular: Normal rate, regular rhythm and normal heart sounds.  Exam reveals no gallop and no friction rub.   No murmur heard. Pulmonary/Chest: Effort normal and breath sounds normal. No respiratory distress. He has no wheezes. He has no rales. He exhibits no tenderness.  Abdominal: He exhibits no distension and no mass. There is no tenderness. There is no rebound and no guarding.  Musculoskeletal: He exhibits no edema and no tenderness.  Lymphadenopathy:    He has no cervical adenopathy.  Neurological: He is alert and oriented to person, place, and time. He has normal reflexes. He exhibits normal muscle tone. Coordination normal.  Skin: Skin is warm and dry. He is not diaphoretic. No erythema. No pallor.  Psychiatric: He has a normal mood and affect. His behavior is normal. Judgment and thought content normal.          Assessment & Plan:  HIV INFECTION Continue Prezista Norvir Truvada consider change to tivicay  And truvada at next visit  MIGRAINE HEADACHE Start low dose inderal and as needed fiorocet

## 2012-01-27 NOTE — Assessment & Plan Note (Signed)
Start low dose inderal and as needed fiorocet

## 2012-01-27 NOTE — Assessment & Plan Note (Signed)
Continue Prezista Norvir Truvada consider change to tivicay  And truvada at next visit

## 2012-01-29 ENCOUNTER — Telehealth: Payer: Self-pay

## 2012-01-29 NOTE — Telephone Encounter (Signed)
Pt experienced pressure in the middle of chest after taking HIV meds.  It lasted for at least one hour.  Pt took Asprin and pain was relieved within 1 1/2 hours.  Pt has noticed this pain before but not associated with medications.  Heaviness in chest sometimes associated with shortness of breath Pt advised he will need office visit ASAP.  We do not have any available this week. I have advised pt to go to Urgent care for eval.   Laurell Josephs, RN

## 2012-02-19 ENCOUNTER — Encounter: Payer: Self-pay | Admitting: *Deleted

## 2012-02-19 DIAGNOSIS — R21 Rash and other nonspecific skin eruption: Secondary | ICD-10-CM

## 2012-02-19 NOTE — Progress Notes (Signed)
Patient ID: Eduardo Leon, male   DOB: May 23, 1985, 26 y.o.   MRN: [id number] Papular rash on mostly ankles and feet x 2 wks.  Some areas excoriated from scratching.  No evidence of infection.  Itches.  Has been taking Benadryl for this itching which comes and goes.  Given appt for 02/20/12.

## 2012-02-19 NOTE — Patient Instructions (Signed)
Instructed to decreased scratching, keep hands/nails clean.  Could try OTC Claritin for itching.  RTC tomorrow.

## 2012-02-20 ENCOUNTER — Encounter: Payer: Self-pay | Admitting: *Deleted

## 2012-02-20 ENCOUNTER — Ambulatory Visit (INDEPENDENT_AMBULATORY_CARE_PROVIDER_SITE_OTHER): Payer: Self-pay | Admitting: Internal Medicine

## 2012-02-20 DIAGNOSIS — R21 Rash and other nonspecific skin eruption: Secondary | ICD-10-CM

## 2012-02-20 NOTE — Progress Notes (Signed)
Pt reports exposure to multiple outdoor cats in his apartment complex.  Rash appears to be "flea bites" due to distrubution around bilateral ankles and feet.  Dr. Luciana Axe recommends using OTC hydrocortisone cream per the package instructions for the itching and inflammation.  RN also advised OTC Benadryl + hydrocortisone cream due to somnolence from oral Benadryl.  Also recommend "flea" treatment of carpeted areas of his apartment and outside entrance.  Discussed talking with the apartment manager about assistance with carpet treatment due to feline population in the apartment complex.

## 2012-02-20 NOTE — Patient Instructions (Signed)
Dr. Luciana Axe recommends using OTC hydrocortisone cream per the package instructions for the itching and inflammation.  RN also advised OTC Benadryl + hydrocortisone cream due to somnolence from oral Benadryl.   Also recommend "flea" treatment of carpeted areas of his apartment and outside entrance.  Discussed talking with the apartment manager about assistance with carpet treatment due to resident feline population in the apartment complex and possible reinfestations.  Monitor areas for possible infection.  If symptoms of infection appear use OTC antibiotic cream/ointment on affected areas.

## 2012-02-24 ENCOUNTER — Ambulatory Visit: Payer: Self-pay

## 2012-02-25 ENCOUNTER — Ambulatory Visit: Payer: Self-pay | Admitting: Infectious Disease

## 2012-03-16 ENCOUNTER — Other Ambulatory Visit: Payer: Self-pay

## 2012-03-31 ENCOUNTER — Telehealth: Payer: Self-pay | Admitting: Licensed Clinical Social Worker

## 2012-03-31 ENCOUNTER — Ambulatory Visit: Payer: Self-pay | Admitting: Infectious Disease

## 2012-03-31 NOTE — Telephone Encounter (Signed)
I called the patient because he missed his appointment, his phone was disconnected.

## 2012-03-31 NOTE — Telephone Encounter (Signed)
I called the patient and left a message for him to reschedule, he is in the start research program and Dr. Daiva Eves said their was no rush for him to be seen.

## 2012-05-19 ENCOUNTER — Telehealth: Payer: Self-pay | Admitting: *Deleted

## 2012-05-19 NOTE — Telephone Encounter (Signed)
Patient called inquiring about delivery of his medications.  He states he wasn't sure how or when they were coming to him.  He has a new phone number [phone]) but has not set up voicemail.  I advised him that I would call the specialty pharmacy delivery service to give them his new number and set up a delivery, stating that it takes approximately 1 week.  He claims to have not missed any doses of his medications and has about 1 week of pills left.  I called the specialty pharmacy; they have not delivered to him since July 2013.  I confirmed his prescriptions, phone number, and address and set delivery for Wednesday, February 5th.   During phone call with patient, I impressed upon him the importance of keeping his appointments.  I gave him new appointments for 05/28/12 (lab) and 06/14/12 (Dr. Daiva Eves) and notified him of our new No Show policy, stating that this is his last chance to be scheduled.  He verbalized understanding and stated he would ask today for the time off to be sure that he would be able to make those appointments. Andree Coss, RN

## 2012-05-28 ENCOUNTER — Other Ambulatory Visit: Payer: Self-pay

## 2012-06-14 ENCOUNTER — Ambulatory Visit: Payer: Self-pay | Admitting: Infectious Disease

## 2012-07-26 ENCOUNTER — Other Ambulatory Visit: Payer: Self-pay

## 2012-08-02 ENCOUNTER — Ambulatory Visit: Payer: Self-pay

## 2012-08-02 ENCOUNTER — Other Ambulatory Visit (INDEPENDENT_AMBULATORY_CARE_PROVIDER_SITE_OTHER): Payer: Self-pay

## 2012-08-02 DIAGNOSIS — B20 Human immunodeficiency virus [HIV] disease: Secondary | ICD-10-CM

## 2012-08-02 LAB — CBC WITH DIFFERENTIAL/PLATELET
Basophils Absolute: 0 10*3/uL (ref 0.0–0.1)
Basophils Relative: 1 % (ref 0–1)
Eosinophils Absolute: 0 10*3/uL (ref 0.0–0.7)
Eosinophils Relative: 0 % (ref 0–5)
HCT: 46.9 % (ref 39.0–52.0)
MCHC: 35.6 g/dL (ref 30.0–36.0)
MCV: 87 fL (ref 78.0–100.0)
Monocytes Absolute: 0.5 10*3/uL (ref 0.1–1.0)
RDW: 13.7 % (ref 11.5–15.5)

## 2012-08-02 LAB — COMPLETE METABOLIC PANEL WITH GFR
AST: 25 U/L (ref 0–37)
Alkaline Phosphatase: 79 U/L (ref 39–117)
BUN: 8 mg/dL (ref 6–23)
Calcium: 9.5 mg/dL (ref 8.4–10.5)
Creat: 0.99 mg/dL (ref 0.50–1.35)
Total Bilirubin: 0.5 mg/dL (ref 0.3–1.2)

## 2012-08-03 LAB — HIV-1 RNA QUANT-NO REFLEX-BLD
HIV 1 RNA Quant: 37 copies/mL — ABNORMAL HIGH (ref <20)
HIV-1 RNA Quant, Log: 1.57 {Log} — ABNORMAL HIGH (ref <1.30)

## 2012-08-03 LAB — T-HELPER CELL (CD4) - (RCID CLINIC ONLY): CD4 % Helper T Cell: 18 % — ABNORMAL LOW (ref 33–55)

## 2012-08-30 ENCOUNTER — Ambulatory Visit (INDEPENDENT_AMBULATORY_CARE_PROVIDER_SITE_OTHER): Payer: Self-pay | Admitting: Infectious Disease

## 2012-08-30 ENCOUNTER — Encounter: Payer: Self-pay | Admitting: Infectious Disease

## 2012-08-30 VITALS — BP 128/78 | HR 73 | Temp 98.8°F | Ht 66.0 in | Wt 176.0 lb

## 2012-08-30 DIAGNOSIS — Z113 Encounter for screening for infections with a predominantly sexual mode of transmission: Secondary | ICD-10-CM

## 2012-08-30 DIAGNOSIS — G43909 Migraine, unspecified, not intractable, without status migrainosus: Secondary | ICD-10-CM

## 2012-08-30 DIAGNOSIS — A749 Chlamydial infection, unspecified: Secondary | ICD-10-CM

## 2012-08-30 DIAGNOSIS — B20 Human immunodeficiency virus [HIV] disease: Secondary | ICD-10-CM

## 2012-08-30 NOTE — Progress Notes (Signed)
  Subjective:    Patient ID: Eduardo Leon, male    DOB: 09-Jul-1985, 27 y.o.   MRN: [id number]  HPI  27 year old [place] man with HIV previously well controlled on isentress and truvada but  Then noncompliant. His HIV genotype and HIV integrase genotype were without resistance. He was changed to prezista norvir and truvada with excellent control. He is now with better controlled with VL in 30s and CD4 healthy again.   He continues to have migrainous headaches  And is taking OTC meds for this. I had written rx for fiorocet and inderal as prophylactic medicine but he is not taking this.    Review of Systems  Constitutional: Negative for fever, chills, diaphoresis, activity change, appetite change, fatigue and unexpected weight change.  HENT: Negative for congestion, sore throat, rhinorrhea, sneezing, trouble swallowing and sinus pressure.   Eyes: Negative for photophobia and visual disturbance.  Respiratory: Negative for cough, chest tightness, shortness of breath, wheezing and stridor.   Cardiovascular: Negative for chest pain, palpitations and leg swelling.  Gastrointestinal: Negative for nausea, vomiting, abdominal pain, diarrhea, constipation, blood in stool, abdominal distention and anal bleeding.  Genitourinary: Negative for dysuria, hematuria, flank pain and difficulty urinating.  Musculoskeletal: Negative for myalgias, back pain, joint swelling, arthralgias and gait problem.  Skin: Negative for color change, pallor, rash and wound.  Neurological: Positive for headaches. Negative for dizziness, tremors, weakness and light-headedness.  Hematological: Negative for adenopathy. Does not bruise/bleed easily.  Psychiatric/Behavioral: Negative for behavioral problems, confusion, sleep disturbance, dysphoric mood, decreased concentration and agitation.       Objective:   Physical Exam  Constitutional: He is oriented to person, place, and time. He appears well-developed and  well-nourished. No distress.  HENT:  Head: Normocephalic and atraumatic.  Mouth/Throat: Oropharynx is clear and moist. No oropharyngeal exudate.  Eyes: Conjunctivae and EOM are normal. Pupils are equal, round, and reactive to light. No scleral icterus.  Neck: Normal range of motion. Neck supple. No JVD present.  Cardiovascular: Normal rate, regular rhythm and normal heart sounds.  Exam reveals no gallop and no friction rub.   No murmur heard. Pulmonary/Chest: Effort normal and breath sounds normal. No respiratory distress. He has no wheezes. He has no rales. He exhibits no tenderness.  Abdominal: He exhibits no distension and no mass. There is no tenderness. There is no rebound and no guarding.  Musculoskeletal: He exhibits no edema and no tenderness.  Lymphadenopathy:    He has no cervical adenopathy.  Neurological: He is alert and oriented to person, place, and time. He has normal reflexes. He exhibits normal muscle tone. Coordination normal.  Skin: Skin is warm and dry. He is not diaphoretic. No erythema. No pallor.  Psychiatric: He has a normal mood and affect. His behavior is normal. Judgment and thought content normal.          Assessment & Plan:  HIV: continue prezista, norvir and truvada, recheck labs in a few months  Headaches: he will continue his otc meds for now  Chlamydia: sp rx, recheck today

## 2012-09-02 ENCOUNTER — Telehealth: Payer: Self-pay | Admitting: *Deleted

## 2012-09-02 NOTE — Telephone Encounter (Signed)
Pt advised that test results were negative.  Pt verbalized understanding.

## 2012-10-29 ENCOUNTER — Other Ambulatory Visit: Payer: Self-pay | Admitting: Licensed Clinical Social Worker

## 2012-10-29 DIAGNOSIS — B20 Human immunodeficiency virus [HIV] disease: Secondary | ICD-10-CM

## 2012-10-29 MED ORDER — DARUNAVIR ETHANOLATE 800 MG PO TABS: 800.0000 mg | ORAL_TABLET | Freq: Every day | ORAL | Status: DC

## 2012-10-29 MED ORDER — RITONAVIR 100 MG PO TABS: 100.0000 mg | ORAL_TABLET | Freq: Every day | ORAL | Status: DC

## 2012-10-29 MED ORDER — EMTRICITABINE-TENOFOVIR DF 200-300 MG PO TABS: 1.0000 | ORAL_TABLET | Freq: Every day | ORAL | Status: DC

## 2012-11-01 ENCOUNTER — Other Ambulatory Visit: Payer: Self-pay | Admitting: *Deleted

## 2012-11-01 DIAGNOSIS — B20 Human immunodeficiency virus [HIV] disease: Secondary | ICD-10-CM

## 2012-11-01 MED ORDER — RITONAVIR 100 MG PO TABS: 100.0000 mg | ORAL_TABLET | Freq: Every day | ORAL | Status: DC

## 2012-11-01 MED ORDER — DARUNAVIR ETHANOLATE 800 MG PO TABS: 800.0000 mg | ORAL_TABLET | Freq: Every day | ORAL | Status: DC

## 2012-11-01 MED ORDER — EMTRICITABINE-TENOFOVIR DF 200-300 MG PO TABS: 1.0000 | ORAL_TABLET | Freq: Every day | ORAL | Status: DC

## 2012-11-01 NOTE — Telephone Encounter (Signed)
Pharmacy called and advised patient needs refills on his meds.

## 2012-11-29 ENCOUNTER — Ambulatory Visit: Payer: Self-pay

## 2012-11-29 ENCOUNTER — Other Ambulatory Visit (INDEPENDENT_AMBULATORY_CARE_PROVIDER_SITE_OTHER): Payer: Self-pay

## 2012-11-29 DIAGNOSIS — Z113 Encounter for screening for infections with a predominantly sexual mode of transmission: Secondary | ICD-10-CM

## 2012-11-29 DIAGNOSIS — B20 Human immunodeficiency virus [HIV] disease: Secondary | ICD-10-CM

## 2012-11-29 LAB — CBC WITH DIFFERENTIAL/PLATELET
Basophils Relative: 1 % (ref 0–1)
HCT: 45.9 % (ref 39.0–52.0)
Hemoglobin: 16.5 g/dL (ref 13.0–17.0)
Lymphocytes Relative: 53 % — ABNORMAL HIGH (ref 12–46)
Lymphs Abs: 2.7 10*3/uL (ref 0.7–4.0)
Monocytes Absolute: 0.7 10*3/uL (ref 0.1–1.0)
Monocytes Relative: 15 % — ABNORMAL HIGH (ref 3–12)
Neutro Abs: 1.6 10*3/uL — ABNORMAL LOW (ref 1.7–7.7)
Neutrophils Relative %: 31 % — ABNORMAL LOW (ref 43–77)
RBC: 5.35 MIL/uL (ref 4.22–5.81)
WBC: 5.1 10*3/uL (ref 4.0–10.5)

## 2012-11-29 LAB — COMPLETE METABOLIC PANEL WITH GFR
Alkaline Phosphatase: 98 U/L (ref 39–117)
CO2: 27 mEq/L (ref 19–32)
Creat: 1.04 mg/dL (ref 0.50–1.35)
GFR, Est African American: >89 mL/min
GFR, Est Non African American: >89 mL/min
Glucose, Bld: 88 mg/dL (ref 70–99)
Sodium: 136 mEq/L (ref 135–145)
Total Bilirubin: 0.7 mg/dL (ref 0.3–1.2)

## 2012-11-29 LAB — RPR

## 2012-11-30 ENCOUNTER — Ambulatory Visit: Payer: Self-pay

## 2012-12-01 LAB — T-HELPER CELL (CD4) - (RCID CLINIC ONLY)
CD4 % Helper T Cell: 21 % — ABNORMAL LOW (ref 33–55)
CD4 T Cell Abs: 570 uL (ref 400–2700)

## 2012-12-23 ENCOUNTER — Ambulatory Visit: Payer: Self-pay | Admitting: Infectious Disease

## 2012-12-29 ENCOUNTER — Encounter: Payer: Self-pay | Admitting: Infectious Disease

## 2012-12-29 ENCOUNTER — Ambulatory Visit (INDEPENDENT_AMBULATORY_CARE_PROVIDER_SITE_OTHER): Payer: Self-pay | Admitting: Infectious Disease

## 2012-12-29 VITALS — BP 133/78 | HR 70 | Temp 98.6°F | Wt 164.0 lb

## 2012-12-29 DIAGNOSIS — B20 Human immunodeficiency virus [HIV] disease: Secondary | ICD-10-CM

## 2012-12-29 DIAGNOSIS — G43909 Migraine, unspecified, not intractable, without status migrainosus: Secondary | ICD-10-CM

## 2012-12-29 DIAGNOSIS — A749 Chlamydial infection, unspecified: Secondary | ICD-10-CM

## 2012-12-29 NOTE — Progress Notes (Signed)
  Subjective:    Patient ID: Eduardo Leon, male    DOB: Nov 20, 1985, 27 y.o.   MRN: [id number]  HPI   27 year old [place] man with HIV previously well controlled on isentress and truvada but  Then noncompliant. His HIV genotype and HIV integrase genotype were without resistance. He was changed to prezista norvir and truvada with perfect control.   He has no specific complaints today he is not sexually active at present.    Review of Systems  Constitutional: Negative for fever, chills, diaphoresis, activity change, appetite change, fatigue and unexpected weight change.  HENT: Negative for congestion, sore throat, rhinorrhea, sneezing, trouble swallowing and sinus pressure.   Eyes: Negative for photophobia and visual disturbance.  Respiratory: Negative for cough, chest tightness, shortness of breath, wheezing and stridor.   Cardiovascular: Negative for chest pain, palpitations and leg swelling.  Gastrointestinal: Negative for nausea, vomiting, abdominal pain, diarrhea, constipation, blood in stool, abdominal distention and anal bleeding.  Genitourinary: Negative for dysuria, hematuria, flank pain and difficulty urinating.  Musculoskeletal: Negative for myalgias, back pain, joint swelling, arthralgias and gait problem.  Skin: Negative for color change, pallor, rash and wound.  Neurological: Positive for headaches. Negative for dizziness, tremors, weakness and light-headedness.  Hematological: Negative for adenopathy. Does not bruise/bleed easily.  Psychiatric/Behavioral: Negative for behavioral problems, confusion, sleep disturbance, dysphoric mood, decreased concentration and agitation.       Objective:   Physical Exam  Constitutional: He is oriented to person, place, and time. He appears well-developed and well-nourished. No distress.  HENT:  Head: Normocephalic and atraumatic.  Mouth/Throat: Oropharynx is clear and moist. No oropharyngeal exudate.  Eyes: Conjunctivae and  EOM are normal. Pupils are equal, round, and reactive to light. No scleral icterus.  Neck: Normal range of motion. Neck supple. No JVD present.  Cardiovascular: Normal rate, regular rhythm and normal heart sounds.  Exam reveals no gallop and no friction rub.   No murmur heard. Pulmonary/Chest: Effort normal and breath sounds normal. No respiratory distress. He has no wheezes. He has no rales. He exhibits no tenderness.  Abdominal: He exhibits no distension and no mass. There is no tenderness. There is no rebound and no guarding.  Musculoskeletal: He exhibits no edema and no tenderness.  Lymphadenopathy:    He has no cervical adenopathy.  Neurological: He is alert and oriented to person, place, and time. He has normal reflexes. He exhibits normal muscle tone. Coordination normal.  Skin: Skin is warm and dry. He is not diaphoretic. No erythema. No pallor.  Psychiatric: He has a normal mood and affect. His behavior is normal. Judgment and thought content normal.          Assessment & Plan:  HIV: continue prezista, norvir and truvada, recheck labs in a 4 months   Chlamydia: sp rx, recheck in may was negative. Every morning next visit.  Migraine headaches: He did not raise this issue today.  HCM: Flu shot

## 2013-02-24 ENCOUNTER — Other Ambulatory Visit: Payer: Self-pay

## 2013-04-06 ENCOUNTER — Telehealth: Payer: Self-pay | Admitting: *Deleted

## 2013-04-06 NOTE — Telephone Encounter (Signed)
I don't think he needs a Paramedic.  [facility] just has a non-patient friendly way of requesting refills.

## 2013-04-06 NOTE — Telephone Encounter (Signed)
Does he need a bridge counselor?

## 2013-04-06 NOTE — Telephone Encounter (Signed)
Ok sorry my misunderstanding

## 2013-04-06 NOTE — Telephone Encounter (Signed)
The pt had called [facility] in [place] for refills.  Pt had difficulty with getting the refills.  RN called the [facility] in [place].  RN was given the [phone] in [place] to call.  [address].  Was told that the pt needs to call [facility] not [facility] to place refill order.  The [facility] phone # is on the pt's medication bottle not the [facility] # to request refills.  [facility] [place] offered to call the pt about his refills.

## 2013-05-03 ENCOUNTER — Other Ambulatory Visit: Payer: Self-pay

## 2013-05-13 ENCOUNTER — Ambulatory Visit: Payer: Self-pay | Admitting: Infectious Disease

## 2013-05-18 ENCOUNTER — Ambulatory Visit: Payer: Self-pay | Admitting: Infectious Disease

## 2013-05-23 ENCOUNTER — Telehealth: Payer: Self-pay

## 2013-05-23 NOTE — Telephone Encounter (Signed)
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  He has been treated with 2.4 [facility]. B[facility]    Laurell Josephsammy K Michele Kerlin, RN

## 2013-05-24 NOTE — Telephone Encounter (Signed)
Lets make sure we test him again. His RPR was negative in August

## 2013-06-02 ENCOUNTER — Encounter: Payer: Self-pay | Admitting: *Deleted

## 2013-07-28 ENCOUNTER — Telehealth: Payer: Self-pay | Admitting: *Deleted

## 2013-07-28 ENCOUNTER — Encounter: Payer: Self-pay | Admitting: *Deleted

## 2013-07-28 ENCOUNTER — Other Ambulatory Visit: Payer: Self-pay | Admitting: *Deleted

## 2013-07-28 ENCOUNTER — Other Ambulatory Visit (INDEPENDENT_AMBULATORY_CARE_PROVIDER_SITE_OTHER): Payer: Self-pay

## 2013-07-28 ENCOUNTER — Ambulatory Visit: Payer: Self-pay

## 2013-07-28 DIAGNOSIS — B20 Human immunodeficiency virus [HIV] disease: Secondary | ICD-10-CM

## 2013-07-28 DIAGNOSIS — Z79899 Other long term (current) drug therapy: Secondary | ICD-10-CM

## 2013-07-28 LAB — COMPLETE METABOLIC PANEL WITH GFR
ALK PHOS: 82 U/L (ref 39–117)
ALT: 58 U/L — ABNORMAL HIGH (ref 0–53)
AST: 28 U/L (ref 0–37)
Albumin: 4.3 g/dL (ref 3.5–5.2)
BUN: 10 mg/dL (ref 6–23)
CO2: 30 mEq/L (ref 19–32)
Calcium: 9.6 mg/dL (ref 8.4–10.5)
Chloride: 102 mEq/L (ref 96–112)
Creat: 1.02 mg/dL (ref 0.50–1.35)
GFR, Est African American: >89 mL/min
GFR, Est Non African American: >89 mL/min
Glucose, Bld: 102 mg/dL — ABNORMAL HIGH (ref 70–99)
POTASSIUM: 3.9 meq/L (ref 3.5–5.3)
Sodium: 138 mEq/L (ref 135–145)
TOTAL PROTEIN: 7.4 g/dL (ref 6.0–8.3)
Total Bilirubin: 0.5 mg/dL (ref 0.2–1.2)

## 2013-07-28 LAB — LIPID PANEL
Cholesterol: 205 mg/dL — ABNORMAL HIGH (ref 0–200)
HDL: 40 mg/dL (ref >39)
LDL CALC: 140 mg/dL — AB (ref 0–99)
Total CHOL/HDL Ratio: 5.1 Ratio
Triglycerides: 127 mg/dL (ref <150)
VLDL: 25 mg/dL (ref 0–40)

## 2013-07-28 LAB — CBC WITH DIFFERENTIAL/PLATELET
BASOS ABS: 0.1 10*3/uL (ref 0.0–0.1)
BASOS PCT: 1 % (ref 0–1)
Eosinophils Absolute: 0.1 10*3/uL (ref 0.0–0.7)
Eosinophils Relative: 1 % (ref 0–5)
HEMATOCRIT: 46.5 % (ref 39.0–52.0)
Hemoglobin: 16.2 g/dL (ref 13.0–17.0)
Lymphocytes Relative: 58 % — ABNORMAL HIGH (ref 12–46)
Lymphs Abs: 3.2 10*3/uL (ref 0.7–4.0)
MCH: 30.4 pg (ref 26.0–34.0)
MCHC: 34.8 g/dL (ref 30.0–36.0)
MCV: 87.2 fL (ref 78.0–100.0)
MONO ABS: 0.8 10*3/uL (ref 0.1–1.0)
Monocytes Relative: 14 % — ABNORMAL HIGH (ref 3–12)
Neutro Abs: 1.4 10*3/uL — ABNORMAL LOW (ref 1.7–7.7)
Neutrophils Relative %: 26 % — ABNORMAL LOW (ref 43–77)
Platelets: 288 10*3/uL (ref 150–400)
RBC: 5.33 MIL/uL (ref 4.22–5.81)
RDW: 13.4 % (ref 11.5–15.5)
WBC: 5.5 10*3/uL (ref 4.0–10.5)

## 2013-07-28 MED ORDER — RITONAVIR 100 MG PO TABS: 100.0000 mg | ORAL_TABLET | Freq: Every day | ORAL | Status: DC

## 2013-07-28 MED ORDER — EMTRICITABINE-TENOFOVIR DF 200-300 MG PO TABS: 1.0000 | ORAL_TABLET | Freq: Every day | ORAL | Status: DC

## 2013-07-28 MED ORDER — DARUNAVIR ETHANOLATE 800 MG PO TABS: 800.0000 mg | ORAL_TABLET | Freq: Every day | ORAL | Status: DC

## 2013-07-28 NOTE — Progress Notes (Signed)
ADAP application 

## 2013-07-28 NOTE — Telephone Encounter (Signed)
Patient c/o rectal bleeding x 1 week; associated with a bowel movement. He said it is not diarrhea, but he is also c/o stomach cramping when he takes his HIV meds. He has been using Imodium and feels it is not effective anymore. Advised patient to not use Imodium since he is not having diarrhea and this may be causing him to have hard stools.Also to make sure he is drinking plenty of water. Encouraged him to continue taking his HIV meds and scheduled appt with Dr. Luciana Axeomer for 08/02/13. Dr. Daiva EvesVan Dam did not have any openings.

## 2013-07-29 LAB — T-HELPER CELL (CD4) - (RCID CLINIC ONLY)
CD4 % Helper T Cell: 22 % — ABNORMAL LOW (ref 33–55)
CD4 T Cell Abs: 680 /uL (ref 400–2700)

## 2013-07-30 LAB — HIV-1 RNA QUANT-NO REFLEX-BLD: HIV-1 RNA Quant, Log: <1.3 {Log} (ref <1.30)

## 2013-08-02 ENCOUNTER — Encounter: Payer: Self-pay | Admitting: Internal Medicine

## 2013-08-02 ENCOUNTER — Ambulatory Visit (INDEPENDENT_AMBULATORY_CARE_PROVIDER_SITE_OTHER): Payer: Self-pay | Admitting: Internal Medicine

## 2013-08-02 VITALS — BP 134/86 | HR 65 | Temp 98.3°F | Ht 66.0 in | Wt 171.0 lb

## 2013-08-02 DIAGNOSIS — Z23 Encounter for immunization: Secondary | ICD-10-CM

## 2013-08-02 DIAGNOSIS — B20 Human immunodeficiency virus [HIV] disease: Secondary | ICD-10-CM

## 2013-08-02 DIAGNOSIS — K648 Other hemorrhoids: Secondary | ICD-10-CM

## 2013-08-02 MED ORDER — HYDROCORTISONE ACETATE 25 MG RE SUPP: 25.0000 mg | Freq: Two times a day (BID) | RECTAL | Status: DC

## 2013-08-02 MED ORDER — ELVITEG-COBIC-EMTRICIT-TENOFDF 150-150-200-300 MG PO TABS: 1.0000 | ORAL_TABLET | Freq: Every day | ORAL | Status: DC

## 2013-08-02 NOTE — Progress Notes (Signed)
   Subjective:    Patient ID: Eduardo Leon, male    DOB: January 10, 1986, 28 y.o.   MRN: [id number]  HPI Here for a work in visit.  Complaint of BRBPR.  History of hemorrhoids.  No discomfort.  Normal stools, not hard.  No dizziness.  Also some abdominal discomfort with meds. Is hopeful for a 1 pill a day regimen.  Denies missing any doses.      Review of Systems  Constitutional: Negative for fatigue.  Respiratory: Negative for shortness of breath.   Gastrointestinal: Positive for abdominal distention. Negative for nausea and diarrhea.  Skin: Negative for rash.  Neurological: Negative for dizziness and light-headedness.  Psychiatric/Behavioral: Negative for dysphoric mood.       Objective:   Physical Exam  Constitutional: He appears well-developed and well-nourished. No distress.  HENT:  Mouth/Throat: No oropharyngeal exudate.  Eyes: No scleral icterus.  Cardiovascular: Normal rate, regular rhythm and normal heart sounds.   No murmur heard. Pulmonary/Chest: Effort normal and breath sounds normal. No respiratory distress.  Genitourinary:  Anorectal exam with no hemorrhoids noted  Lymphadenopathy:    He has no cervical adenopathy.          Assessment & Plan:

## 2013-08-02 NOTE — Addendum Note (Signed)
Addended by: Andree CossHOWELL, Abisai Deer M on: 08/02/2013 03:48 PM   Modules accepted: Orders

## 2013-08-02 NOTE — Assessment & Plan Note (Signed)
Likely internal hemorroid though not visualized.  Will try anusol suppository.  RTC in about 1-2 months.

## 2013-08-02 NOTE — Assessment & Plan Note (Signed)
He is doing well with recent labs and I discussed changing to Stribild with his next refill.  I will confer with his primary provider.

## 2013-08-23 ENCOUNTER — Telehealth: Payer: Self-pay | Admitting: Licensed Clinical Social Worker

## 2013-08-23 NOTE — Telephone Encounter (Signed)
Patient was injured at [employer] a fire extinguisher fell off the wall and hit his foot, he had a cut that has healed but has a knot present with moderate pain. He cannot wear a shoe comfortably. He wanted to be seen, there is nothing available here at the clinic. Patient missing work and does not have insurance. Can he be worked in on you schedule today?

## 2013-08-23 NOTE — Telephone Encounter (Signed)
Ok no worries

## 2013-08-23 NOTE — Telephone Encounter (Signed)
Patient went to Urgent Care already, thank you anyway!

## 2013-08-23 NOTE — Telephone Encounter (Signed)
I just sent you guys a phone message. Can put him in but I may have to have my daughter dropped off with me if clinic is running late. I have only one car now

## 2013-09-01 ENCOUNTER — Ambulatory Visit (INDEPENDENT_AMBULATORY_CARE_PROVIDER_SITE_OTHER): Payer: Self-pay | Admitting: *Deleted

## 2013-09-01 DIAGNOSIS — Z23 Encounter for immunization: Secondary | ICD-10-CM

## 2013-09-27 ENCOUNTER — Other Ambulatory Visit: Payer: Self-pay

## 2013-10-11 ENCOUNTER — Ambulatory Visit: Payer: Self-pay | Admitting: Internal Medicine

## 2013-10-11 ENCOUNTER — Telehealth: Payer: Self-pay | Admitting: *Deleted

## 2013-10-11 NOTE — Telephone Encounter (Signed)
Left message reminding patient of his missed appointment. Andree CossHowell, Charlett Merkle M, RN

## 2014-01-10 ENCOUNTER — Other Ambulatory Visit: Payer: Self-pay | Admitting: Licensed Clinical Social Worker

## 2014-01-10 ENCOUNTER — Other Ambulatory Visit (INDEPENDENT_AMBULATORY_CARE_PROVIDER_SITE_OTHER): Payer: Self-pay

## 2014-01-10 ENCOUNTER — Telehealth: Payer: Self-pay | Admitting: Licensed Clinical Social Worker

## 2014-01-10 DIAGNOSIS — Z113 Encounter for screening for infections with a predominantly sexual mode of transmission: Secondary | ICD-10-CM

## 2014-01-10 DIAGNOSIS — B20 Human immunodeficiency virus [HIV] disease: Secondary | ICD-10-CM

## 2014-01-10 LAB — CBC WITH DIFFERENTIAL/PLATELET
Basophils Absolute: 0 10*3/uL (ref 0.0–0.1)
Basophils Relative: 1 % (ref 0–1)
EOS ABS: 0 10*3/uL (ref 0.0–0.7)
Eosinophils Relative: 0 % (ref 0–5)
HEMATOCRIT: 45.9 % (ref 39.0–52.0)
HEMOGLOBIN: 16.6 g/dL (ref 13.0–17.0)
LYMPHS ABS: 2.8 10*3/uL (ref 0.7–4.0)
Lymphocytes Relative: 58 % — ABNORMAL HIGH (ref 12–46)
MCH: 30.8 pg (ref 26.0–34.0)
MCHC: 36.2 g/dL — ABNORMAL HIGH (ref 30.0–36.0)
MCV: 85.2 fL (ref 78.0–100.0)
MONO ABS: 0.6 10*3/uL (ref 0.1–1.0)
MONOS PCT: 12 % (ref 3–12)
NEUTROS PCT: 29 % — AB (ref 43–77)
Neutro Abs: 1.4 10*3/uL — ABNORMAL LOW (ref 1.7–7.7)
Platelets: 346 10*3/uL (ref 150–400)
RBC: 5.39 MIL/uL (ref 4.22–5.81)
RDW: 13.6 % (ref 11.5–15.5)
WBC: 4.8 10*3/uL (ref 4.0–10.5)

## 2014-01-10 LAB — COMPLETE METABOLIC PANEL WITH GFR
ALK PHOS: 96 U/L (ref 39–117)
ALT: 33 U/L (ref 0–53)
AST: 22 U/L (ref 0–37)
Albumin: 4.4 g/dL (ref 3.5–5.2)
BILIRUBIN TOTAL: 0.4 mg/dL (ref 0.2–1.2)
BUN: 13 mg/dL (ref 6–23)
CO2: 25 meq/L (ref 19–32)
CREATININE: 0.96 mg/dL (ref 0.50–1.35)
Calcium: 9.7 mg/dL (ref 8.4–10.5)
Chloride: 103 mEq/L (ref 96–112)
GFR, Est African American: >89 mL/min
GFR, Est Non African American: >89 mL/min
Glucose, Bld: 105 mg/dL — ABNORMAL HIGH (ref 70–99)
Potassium: 4.5 mEq/L (ref 3.5–5.3)
SODIUM: 137 meq/L (ref 135–145)
Total Protein: 8.3 g/dL (ref 6.0–8.3)

## 2014-01-10 NOTE — Telephone Encounter (Signed)
Ok to overbook.

## 2014-01-10 NOTE — Telephone Encounter (Signed)
Patient coming in for labs today, but Eduardo Leon wanted to see Dr. Luciana Axe in the next week or two. There is no available appointments for any provider, can we overbook a day? Please advise

## 2014-01-11 ENCOUNTER — Telehealth: Payer: Self-pay | Admitting: Licensed Clinical Social Worker

## 2014-01-11 ENCOUNTER — Ambulatory Visit: Payer: Self-pay

## 2014-01-11 LAB — T-HELPER CELL (CD4) - (RCID CLINIC ONLY)
CD4 T CELL HELPER: 21 % — AB (ref 33–55)
CD4 T Cell Abs: 640 /uL (ref 400–2700)

## 2014-01-11 LAB — HIV-1 RNA QUANT-NO REFLEX-BLD

## 2014-01-11 LAB — URINE CYTOLOGY ANCILLARY ONLY
CHLAMYDIA, DNA PROBE: NEGATIVE
Neisseria Gonorrhea: NEGATIVE

## 2014-01-11 LAB — FLUORESCENT TREPONEMAL AB(FTA)-IGG-BLD: Fluorescent Treponemal ABS: REACTIVE — AB

## 2014-01-11 LAB — RPR: RPR Ser Ql: REACTIVE — AB

## 2014-01-11 LAB — RPR TITER: RPR Titer: 1:128 {titer} — AB

## 2014-01-11 NOTE — Telephone Encounter (Signed)
Left message for patient to call the office. Patient will need to be scheduled for nurse visit to be treated for positive RPR

## 2014-01-11 NOTE — Telephone Encounter (Signed)
error 

## 2014-01-11 NOTE — Progress Notes (Signed)
Patient came in specifically because he had unprotected sex 2 months ago and started having symptoms. He also needed HIV labs so I asked him to come in for everything. I called him and left him a message that he needs an appointment for a nurse visit this week.

## 2014-01-11 NOTE — Telephone Encounter (Signed)
Message copied by Harvie Bridge on Wed Jan 11, 2014 11:14 AM ------      Message from: VAN DAM, CORNELIUS N      Created: Wed Jan 11, 2014 10:51 AM       Eduardo Leon needs consider undergoing LP to rule out neurosyphilis vs 3 IM shots of PCN if he has no neurological ssx and has been having unprotected sex I am OK with going with the three shots ------

## 2014-01-12 ENCOUNTER — Ambulatory Visit (INDEPENDENT_AMBULATORY_CARE_PROVIDER_SITE_OTHER): Payer: Self-pay | Admitting: *Deleted

## 2014-01-12 DIAGNOSIS — A539 Syphilis, unspecified: Secondary | ICD-10-CM

## 2014-01-12 MED ORDER — PENICILLIN G BENZATHINE 1200000 UNIT/2ML IM SUSP
1.2000 10*6.[IU] | Freq: Once | INTRAMUSCULAR | Status: AC
  Administered 2014-01-12: 1.2 10*6.[IU] via INTRAMUSCULAR

## 2014-01-16 ENCOUNTER — Ambulatory Visit: Payer: Self-pay

## 2014-01-17 LAB — HLA B*5701: HLA-B 5701 W/RFLX HLA-B HIGH: NEGATIVE

## 2014-01-18 ENCOUNTER — Ambulatory Visit (INDEPENDENT_AMBULATORY_CARE_PROVIDER_SITE_OTHER): Payer: Self-pay | Admitting: Internal Medicine

## 2014-01-18 ENCOUNTER — Encounter: Payer: Self-pay | Admitting: Internal Medicine

## 2014-01-18 VITALS — BP 136/84 | HR 87 | Temp 98.4°F | Wt 174.0 lb

## 2014-01-18 DIAGNOSIS — A539 Syphilis, unspecified: Secondary | ICD-10-CM

## 2014-01-18 DIAGNOSIS — B20 Human immunodeficiency virus [HIV] disease: Secondary | ICD-10-CM

## 2014-01-18 DIAGNOSIS — Z23 Encounter for immunization: Secondary | ICD-10-CM

## 2014-01-18 MED ORDER — PENICILLIN G BENZATHINE 1200000 UNIT/2ML IM SUSP
1.2000 10*6.[IU] | Freq: Once | INTRAMUSCULAR | Status: AC
  Administered 2014-01-18: 1.2 10*6.[IU] via INTRAMUSCULAR

## 2014-01-18 NOTE — Progress Notes (Signed)
   Subjective:    Patient ID: Eduardo Leon, male    DOB: [birth year], 28 y.o.   MRN: [id number]  HPI Here for follow up of HIV.  Generally sees my partner, Dr. Daiva EvesVan Dam but did not show up to last visit.  Changed to Stribild and no issues.  Recently contracted syphilis and has received 1 of 3 weekly shots.  Unprotected sex. Negative 1 year ago.     Review of Systems  Constitutional: Negative for fatigue.  HENT: Negative for sore throat.   Gastrointestinal: Negative for nausea and diarrhea.  Skin: Negative for rash.  Neurological: Negative for dizziness, facial asymmetry, weakness and light-headedness.  Psychiatric/Behavioral: Negative for sleep disturbance.       Objective:   Physical Exam  Constitutional: He is oriented to person, place, and time. He appears well-developed and well-nourished. No distress.  HENT:  Mouth/Throat: No oropharyngeal exudate.  Eyes: No scleral icterus.  Cardiovascular: Normal rate, regular rhythm and normal heart sounds.   No murmur heard. Pulmonary/Chest: Breath sounds normal. No respiratory distress. He has no wheezes.  Lymphadenopathy:    He has no cervical adenopathy.  Neurological: He is alert and oriented to person, place, and time. No cranial nerve deficit.  Skin: No rash noted.          Assessment & Plan:

## 2014-01-18 NOTE — Assessment & Plan Note (Signed)
Doing great on one pill stribild.  No SE.  RTC 4 months after labs.

## 2014-01-18 NOTE — Assessment & Plan Note (Signed)
Discussed risks.  Will do treatment x 3 weeks.  No neurologic symptoms to warrant LP.  Will monitor RPR

## 2014-01-23 ENCOUNTER — Other Ambulatory Visit: Payer: Self-pay | Admitting: *Deleted

## 2014-01-23 DIAGNOSIS — B20 Human immunodeficiency virus [HIV] disease: Secondary | ICD-10-CM

## 2014-01-23 MED ORDER — ELVITEG-COBIC-EMTRICIT-TENOFDF 150-150-200-300 MG PO TABS: 1.0000 | ORAL_TABLET | Freq: Every day | ORAL | Status: DC

## 2014-01-25 ENCOUNTER — Telehealth: Payer: Self-pay | Admitting: Licensed Clinical Social Worker

## 2014-01-25 ENCOUNTER — Ambulatory Visit (INDEPENDENT_AMBULATORY_CARE_PROVIDER_SITE_OTHER): Payer: Self-pay | Admitting: Licensed Clinical Social Worker

## 2014-01-25 DIAGNOSIS — A539 Syphilis, unspecified: Secondary | ICD-10-CM

## 2014-01-25 DIAGNOSIS — Z23 Encounter for immunization: Secondary | ICD-10-CM

## 2014-01-25 DIAGNOSIS — B20 Human immunodeficiency virus [HIV] disease: Secondary | ICD-10-CM

## 2014-01-25 MED ORDER — PENICILLIN G BENZATHINE 1200000 UNIT/2ML IM SUSP
1.2000 10*6.[IU] | Freq: Once | INTRAMUSCULAR | Status: AC
  Administered 2014-01-25: 1.2 10*6.[IU] via INTRAMUSCULAR

## 2014-01-25 NOTE — Telephone Encounter (Signed)
Patient states that when he takes his Stribild every night afterwards his hearts start pounding until he goes to bed. He stated that it did not start until he started taking it with food. If he doesn't take it with food he has  nausea and  upset stomach. Please advise.

## 2014-01-26 NOTE — Telephone Encounter (Signed)
That's fine

## 2014-01-26 NOTE — Telephone Encounter (Signed)
Not a typical reaction to Stribild and at his visit he was fine.  He didn't tolerate his last regimen well either.  If he wants to consider changing again, he should see his primary provider, Dr. Daiva EvesVan Dam.

## 2014-01-26 NOTE — Telephone Encounter (Signed)
I think he has hired you know because he told me that he rather see you. Are you ok with that?

## 2014-01-27 ENCOUNTER — Other Ambulatory Visit: Payer: Self-pay | Admitting: Licensed Clinical Social Worker

## 2014-01-27 DIAGNOSIS — B20 Human immunodeficiency virus [HIV] disease: Secondary | ICD-10-CM

## 2014-01-27 MED ORDER — ELVITEG-COBIC-EMTRICIT-TENOFDF 150-150-200-300 MG PO TABS: 1.0000 | ORAL_TABLET | Freq: Every day | ORAL | Status: DC

## 2014-02-01 ENCOUNTER — Ambulatory Visit: Payer: Self-pay

## 2014-05-11 ENCOUNTER — Other Ambulatory Visit: Payer: Self-pay

## 2014-05-15 ENCOUNTER — Other Ambulatory Visit (INDEPENDENT_AMBULATORY_CARE_PROVIDER_SITE_OTHER): Payer: Self-pay

## 2014-05-15 DIAGNOSIS — Z113 Encounter for screening for infections with a predominantly sexual mode of transmission: Secondary | ICD-10-CM

## 2014-05-15 DIAGNOSIS — B20 Human immunodeficiency virus [HIV] disease: Secondary | ICD-10-CM

## 2014-05-15 DIAGNOSIS — A539 Syphilis, unspecified: Secondary | ICD-10-CM

## 2014-05-15 LAB — CBC WITH DIFFERENTIAL/PLATELET
BASOS PCT: 1 % (ref 0–1)
Basophils Absolute: 0.1 10*3/uL (ref 0.0–0.1)
EOS ABS: 0.1 10*3/uL (ref 0.0–0.7)
EOS PCT: 1 % (ref 0–5)
HCT: 44.2 % (ref 39.0–52.0)
Hemoglobin: 15.9 g/dL (ref 13.0–17.0)
LYMPHS PCT: 56 % — AB (ref 12–46)
Lymphs Abs: 3 10*3/uL (ref 0.7–4.0)
MCH: 30.9 pg (ref 26.0–34.0)
MCHC: 36 g/dL (ref 30.0–36.0)
MCV: 86 fL (ref 78.0–100.0)
MPV: 8.5 fL — ABNORMAL LOW (ref 8.6–12.4)
Monocytes Absolute: 0.6 10*3/uL (ref 0.1–1.0)
Monocytes Relative: 12 % (ref 3–12)
NEUTROS ABS: 1.6 10*3/uL — AB (ref 1.7–7.7)
Neutrophils Relative %: 30 % — ABNORMAL LOW (ref 43–77)
Platelets: 346 10*3/uL (ref 150–400)
RBC: 5.14 MIL/uL (ref 4.22–5.81)
RDW: 13.1 % (ref 11.5–15.5)
WBC: 5.4 10*3/uL (ref 4.0–10.5)

## 2014-05-15 LAB — COMPLETE METABOLIC PANEL WITH GFR
ALBUMIN: 4.2 g/dL (ref 3.5–5.2)
ALT: 33 U/L (ref 0–53)
AST: 19 U/L (ref 0–37)
Alkaline Phosphatase: 86 U/L (ref 39–117)
BILIRUBIN TOTAL: 0.5 mg/dL (ref 0.2–1.2)
BUN: 12 mg/dL (ref 6–23)
CALCIUM: 9.4 mg/dL (ref 8.4–10.5)
CO2: 26 mEq/L (ref 19–32)
Chloride: 103 mEq/L (ref 96–112)
Creat: 1.08 mg/dL (ref 0.50–1.35)
GFR, Est African American: >89 mL/min
GLUCOSE: 101 mg/dL — AB (ref 70–99)
POTASSIUM: 4 meq/L (ref 3.5–5.3)
Sodium: 139 mEq/L (ref 135–145)
Total Protein: 7.7 g/dL (ref 6.0–8.3)

## 2014-05-15 NOTE — Addendum Note (Signed)
Addended bySteva Colder: Bonnie Overdorf on: 05/15/2014 11:15 AM   Modules accepted: Orders

## 2014-05-16 LAB — RPR TITER

## 2014-05-16 LAB — HIV-1 RNA QUANT-NO REFLEX-BLD
HIV 1 RNA Quant: <20 copies/mL (ref <20)
HIV-1 RNA Quant, Log: <1.3 {Log} (ref <1.30)

## 2014-05-16 LAB — T-HELPER CELL (CD4) - (RCID CLINIC ONLY)
CD4 % Helper T Cell: 22 % — ABNORMAL LOW (ref 33–55)
CD4 T CELL ABS: 690 /uL (ref 400–2700)

## 2014-05-16 LAB — RPR: RPR Ser Ql: REACTIVE — AB

## 2014-05-16 LAB — URINE CYTOLOGY ANCILLARY ONLY
Chlamydia: NEGATIVE
Neisseria Gonorrhea: NEGATIVE

## 2014-05-16 LAB — FLUORESCENT TREPONEMAL AB(FTA)-IGG-BLD: FLUORESCENT TREPONEMAL ABS: REACTIVE — AB

## 2014-05-25 ENCOUNTER — Ambulatory Visit: Payer: Self-pay

## 2014-05-25 ENCOUNTER — Ambulatory Visit: Payer: Self-pay | Admitting: Internal Medicine

## 2014-05-26 ENCOUNTER — Other Ambulatory Visit: Payer: Self-pay | Admitting: Licensed Clinical Social Worker

## 2014-05-26 DIAGNOSIS — B20 Human immunodeficiency virus [HIV] disease: Secondary | ICD-10-CM

## 2014-05-26 MED ORDER — ELVITEG-COBIC-EMTRICIT-TENOFDF 150-150-200-300 MG PO TABS: 1.0000 | ORAL_TABLET | Freq: Every day | ORAL | Status: DC

## 2014-05-26 NOTE — Telephone Encounter (Signed)
Patient missed appointment but called the next day because he mixed his dental appointment up with the one here. I gave him his results and he is coming in next week to renew ADAP.

## 2015-06-25 ENCOUNTER — Encounter: Payer: Self-pay | Admitting: Internal Medicine

## 2015-06-25 ENCOUNTER — Ambulatory Visit (INDEPENDENT_AMBULATORY_CARE_PROVIDER_SITE_OTHER): Payer: Self-pay | Admitting: Internal Medicine

## 2015-06-25 VITALS — BP 137/84 | HR 76 | Temp 98.2°F | Ht 66.0 in | Wt 190.0 lb

## 2015-06-25 DIAGNOSIS — Z113 Encounter for screening for infections with a predominantly sexual mode of transmission: Secondary | ICD-10-CM

## 2015-06-25 DIAGNOSIS — B20 Human immunodeficiency virus [HIV] disease: Secondary | ICD-10-CM

## 2015-06-25 DIAGNOSIS — Z79899 Other long term (current) drug therapy: Secondary | ICD-10-CM

## 2015-06-25 DIAGNOSIS — Z23 Encounter for immunization: Secondary | ICD-10-CM

## 2015-06-25 LAB — CBC WITH DIFFERENTIAL/PLATELET
Basophils Absolute: 0.1 10*3/uL (ref 0.0–0.1)
Basophils Relative: 1 % (ref 0–1)
EOS PCT: 1 % (ref 0–5)
Eosinophils Absolute: 0.1 10*3/uL (ref 0.0–0.7)
HEMATOCRIT: 46 % (ref 39.0–52.0)
HEMOGLOBIN: 16.7 g/dL (ref 13.0–17.0)
LYMPHS ABS: 2.7 10*3/uL (ref 0.7–4.0)
LYMPHS PCT: 48 % — AB (ref 12–46)
MCH: 31.6 pg (ref 26.0–34.0)
MCHC: 36.3 g/dL — AB (ref 30.0–36.0)
MCV: 87.1 fL (ref 78.0–100.0)
MPV: 8.6 fL (ref 8.6–12.4)
Monocytes Absolute: 0.6 10*3/uL (ref 0.1–1.0)
Monocytes Relative: 11 % (ref 3–12)
NEUTROS ABS: 2.2 10*3/uL (ref 1.7–7.7)
Neutrophils Relative %: 39 % — ABNORMAL LOW (ref 43–77)
Platelets: 306 10*3/uL (ref 150–400)
RBC: 5.28 MIL/uL (ref 4.22–5.81)
RDW: 12.9 % (ref 11.5–15.5)
WBC: 5.6 10*3/uL (ref 4.0–10.5)

## 2015-06-25 LAB — COMPLETE METABOLIC PANEL WITH GFR
ALBUMIN: 4 g/dL (ref 3.6–5.1)
ALK PHOS: 85 U/L (ref 40–115)
ALT: 26 U/L (ref 9–46)
AST: 17 U/L (ref 10–40)
BILIRUBIN TOTAL: 0.4 mg/dL (ref 0.2–1.2)
BUN: 10 mg/dL (ref 7–25)
CALCIUM: 9.3 mg/dL (ref 8.6–10.3)
CO2: 29 mmol/L (ref 20–31)
CREATININE: 0.95 mg/dL (ref 0.60–1.35)
Chloride: 103 mmol/L (ref 98–110)
GFR, Est Non African American: >89 mL/min (ref >=60)
Glucose, Bld: 100 mg/dL — ABNORMAL HIGH (ref 65–99)
Potassium: 3.7 mmol/L (ref 3.5–5.3)
Sodium: 140 mmol/L (ref 135–146)
Total Protein: 7.6 g/dL (ref 6.1–8.1)

## 2015-06-25 LAB — LIPID PANEL
CHOL/HDL RATIO: 5.7 ratio — AB (ref <=5.0)
CHOLESTEROL: 189 mg/dL (ref 125–200)
HDL: 33 mg/dL — AB (ref >=40)
LDL Cholesterol: 118 mg/dL (ref <130)
Triglycerides: 192 mg/dL — ABNORMAL HIGH (ref <150)
VLDL: 38 mg/dL — ABNORMAL HIGH (ref <30)

## 2015-06-25 MED ORDER — ELVITEG-COBIC-EMTRICIT-TENOFDF 150-150-200-300 MG PO TABS: 1.0000 | ORAL_TABLET | Freq: Every day | ORAL | Status: DC

## 2015-06-25 NOTE — Assessment & Plan Note (Signed)
Seems to be doing well.  Labs today and if ok, can rtc in 6 months. Will do labs at the same time

## 2015-06-25 NOTE — Progress Notes (Signed)
   Subjective:    Patient ID: Eduardo Leon, male    DOB: 1985/07/27, 30 y.o.   MRN: [id number]  HPI Here for follow up of HIV.  Has not seen me or other provider since Sept 2015.  Has remained on Stribild and denies any missed doses.  Feels well.  Wants STI screening.  No complaints.  Undetectable labs about 1 year ago.  Has continued to keep up ADAP.     Review of Systems  Constitutional: Negative for fatigue.  HENT: Negative for trouble swallowing.   Gastrointestinal: Negative for nausea and diarrhea.  Skin: Negative for rash.  Neurological: Negative for dizziness.       Objective:   Physical Exam  Constitutional: He appears well-developed and well-nourished. No distress.  HENT:  Mouth/Throat: No oropharyngeal exudate.  Eyes: No scleral icterus.  Cardiovascular: Normal rate, regular rhythm and normal heart sounds.   No murmur heard. Skin: No rash noted.          Assessment & Plan:

## 2015-06-25 NOTE — Assessment & Plan Note (Signed)
Will screen today 

## 2015-06-26 LAB — RPR

## 2015-06-26 LAB — URINE CYTOLOGY ANCILLARY ONLY
Chlamydia: NEGATIVE
Neisseria Gonorrhea: NEGATIVE

## 2015-06-26 LAB — CYTOLOGY, (ORAL, ANAL, URETHRAL) ANCILLARY ONLY
CHLAMYDIA, DNA PROBE: NEGATIVE
Chlamydia: NEGATIVE
NEISSERIA GONORRHEA: NEGATIVE
Neisseria Gonorrhea: NEGATIVE

## 2015-06-26 LAB — HIV-1 RNA QUANT-NO REFLEX-BLD
HIV 1 RNA Quant: 32 copies/mL — ABNORMAL HIGH (ref <20)
HIV-1 RNA QUANT, LOG: 1.51 {Log_copies}/mL — AB (ref <1.30)

## 2015-06-27 LAB — T-HELPER CELL (CD4) - (RCID CLINIC ONLY)
CD4 % Helper T Cell: 23 % — ABNORMAL LOW (ref 33–55)
CD4 T CELL ABS: 680 /uL (ref 400–2700)

## 2015-07-02 ENCOUNTER — Ambulatory Visit: Payer: Self-pay

## 2015-07-02 LAB — HLA B*5701: HLA-B 5701 W/RFLX HLA-B HIGH: NEGATIVE

## 2015-07-03 ENCOUNTER — Telehealth: Payer: Self-pay | Admitting: *Deleted

## 2015-07-03 NOTE — Telephone Encounter (Signed)
Left patient a message reminding him he was supposed to sign ADAP and [facility] paperwork when he was worked in on 3/6.  Requested that he come in ASAP if he wanted to avoid a gap in medication or a bill for his visit. Andree CossHowell, Ceri Mayer M, RN

## 2015-07-05 ENCOUNTER — Telehealth: Payer: Self-pay | Admitting: *Deleted

## 2015-07-05 DIAGNOSIS — R399 Unspecified symptoms and signs involving the genitourinary system: Secondary | ICD-10-CM

## 2015-07-05 NOTE — Addendum Note (Signed)
Addended by: Jennet MaduroESTRIDGE, Shaylan Tutton D on: 07/05/2015 05:03 PM   Modules accepted: Orders

## 2015-07-05 NOTE — Telephone Encounter (Signed)
Shared Dr. Ephriam Knucklesomer's comments and asked pt to call for lab appt.

## 2015-07-05 NOTE — Telephone Encounter (Signed)
Negative urine cytology.  Pt complaining of continuing, clear penile discharge, no pain or discomfort.  Asking if there is anything he should be doing?  Wanted Dr. Luciana Axeomer to know.  Please advise.

## 2015-07-05 NOTE — Telephone Encounter (Signed)
I am not sure what that could be.  It might be worth checking a urinalysis.  No other symptoms?

## 2015-07-06 ENCOUNTER — Other Ambulatory Visit (INDEPENDENT_AMBULATORY_CARE_PROVIDER_SITE_OTHER): Payer: Self-pay

## 2015-07-06 DIAGNOSIS — R399 Unspecified symptoms and signs involving the genitourinary system: Secondary | ICD-10-CM

## 2015-07-07 LAB — URINALYSIS, MICROSCOPIC ONLY
BACTERIA UA: NONE SEEN [HPF]
CRYSTALS: NONE SEEN [HPF]
Casts: NONE SEEN [LPF]
RBC / HPF: NONE SEEN RBC/HPF (ref <=2)
Squamous Epithelial / LPF: NONE SEEN [HPF] (ref <=5)
YEAST: NONE SEEN [HPF]

## 2015-07-07 LAB — URINALYSIS, ROUTINE W REFLEX MICROSCOPIC
BILIRUBIN URINE: NEGATIVE
GLUCOSE, UA: NEGATIVE
Hgb urine dipstick: NEGATIVE
KETONES UR: NEGATIVE
Nitrite: NEGATIVE
Specific Gravity, Urine: 1.026 (ref 1.001–1.035)
pH: 5.5 (ref 5.0–8.0)

## 2015-07-10 ENCOUNTER — Telehealth: Payer: Self-pay | Admitting: *Deleted

## 2015-07-10 NOTE — Telephone Encounter (Signed)
-----   Message from Gardiner Barefootobert W Comer, MD sent at 07/10/2015  3:28 PM EDT ----- His urine did not show anything concerning.  I am not sure why he has clear discharge but otherwise asymptomatic.  Let me know if there are any changes. thanks

## 2015-07-10 NOTE — Telephone Encounter (Signed)
Notified patient. He states that it has resolved. (on a different note, he will call back in April to speak with [facility] [employer] [facility] regarding [facility] ref[facility]. He has a few weeks left of medication right now). Andree CossHowell, Chala Gul M, RN

## 2015-08-09 ENCOUNTER — Other Ambulatory Visit: Payer: Self-pay | Admitting: *Deleted

## 2015-08-09 DIAGNOSIS — B20 Human immunodeficiency virus [HIV] disease: Secondary | ICD-10-CM

## 2015-08-09 MED ORDER — ELVITEG-COBIC-EMTRICIT-TENOFDF 150-150-200-300 MG PO TABS: 1.0000 | ORAL_TABLET | Freq: Every day | ORAL | Status: DC

## 2015-08-27 ENCOUNTER — Other Ambulatory Visit: Payer: Self-pay | Admitting: *Deleted

## 2015-08-27 DIAGNOSIS — B20 Human immunodeficiency virus [HIV] disease: Secondary | ICD-10-CM

## 2015-08-27 MED ORDER — ELVITEG-COBIC-EMTRICIT-TENOFDF 150-150-200-300 MG PO TABS: 1.0000 | ORAL_TABLET | Freq: Every day | ORAL | Status: DC

## 2015-08-27 NOTE — Telephone Encounter (Signed)
[facility] application. 

## 2015-09-06 ENCOUNTER — Telehealth: Payer: Self-pay

## 2015-09-06 NOTE — Telephone Encounter (Signed)
[facility] wanting to verify patient's telephone number on file as an effort to contact patient about receiving medications. We have the same phone number on file. Magaly stated she has attempted to contact twice. Rejeana Brockandace Murray, LPN

## 2015-09-18 ENCOUNTER — Encounter: Payer: Self-pay | Admitting: Internal Medicine

## 2016-01-21 ENCOUNTER — Ambulatory Visit: Payer: Self-pay

## 2016-01-21 ENCOUNTER — Other Ambulatory Visit: Payer: Self-pay

## 2016-01-29 ENCOUNTER — Ambulatory Visit: Payer: Self-pay

## 2016-01-29 ENCOUNTER — Other Ambulatory Visit (INDEPENDENT_AMBULATORY_CARE_PROVIDER_SITE_OTHER): Payer: Self-pay

## 2016-01-29 DIAGNOSIS — B20 Human immunodeficiency virus [HIV] disease: Secondary | ICD-10-CM

## 2016-01-29 LAB — COMPLETE METABOLIC PANEL WITH GFR
ALT: 50 U/L — AB (ref 9–46)
AST: 28 U/L (ref 10–40)
Albumin: 4 g/dL (ref 3.6–5.1)
Alkaline Phosphatase: 100 U/L (ref 40–115)
BILIRUBIN TOTAL: 0.4 mg/dL (ref 0.2–1.2)
BUN: 11 mg/dL (ref 7–25)
CHLORIDE: 103 mmol/L (ref 98–110)
CO2: 26 mmol/L (ref 20–31)
CREATININE: 1.02 mg/dL (ref 0.60–1.35)
Calcium: 9.3 mg/dL (ref 8.6–10.3)
GLUCOSE: 105 mg/dL — AB (ref 65–99)
Potassium: 4.1 mmol/L (ref 3.5–5.3)
SODIUM: 138 mmol/L (ref 135–146)
TOTAL PROTEIN: 7.9 g/dL (ref 6.1–8.1)

## 2016-01-29 LAB — CBC WITH DIFFERENTIAL/PLATELET
BASOS PCT: 1 %
Basophils Absolute: 65 cells/uL (ref 0–200)
EOS PCT: 2 %
Eosinophils Absolute: 130 cells/uL (ref 15–500)
HCT: 46.7 % (ref 38.5–50.0)
Hemoglobin: 16.1 g/dL (ref 13.2–17.1)
LYMPHS PCT: 42 %
Lymphs Abs: 2730 cells/uL (ref 850–3900)
MCH: 30.8 pg (ref 27.0–33.0)
MCHC: 34.5 g/dL (ref 32.0–36.0)
MCV: 89.5 fL (ref 80.0–100.0)
MONOS PCT: 9 %
MPV: 8.2 fL (ref 7.5–12.5)
Monocytes Absolute: 585 cells/uL (ref 200–950)
NEUTROS ABS: 2990 {cells}/uL (ref 1500–7800)
Neutrophils Relative %: 46 %
PLATELETS: 293 10*3/uL (ref 140–400)
RBC: 5.22 MIL/uL (ref 4.20–5.80)
RDW: 13.3 % (ref 11.0–15.0)
WBC: 6.5 10*3/uL (ref 3.8–10.8)

## 2016-01-30 ENCOUNTER — Encounter: Payer: Self-pay | Admitting: Internal Medicine

## 2016-01-30 LAB — HIV-1 RNA QUANT-NO REFLEX-BLD

## 2016-01-30 LAB — T-HELPER CELL (CD4) - (RCID CLINIC ONLY)
CD4 % Helper T Cell: 18 % — ABNORMAL LOW (ref 33–55)
CD4 T Cell Abs: 520 /uL (ref 400–2700)

## 2016-02-04 ENCOUNTER — Ambulatory Visit: Payer: Self-pay | Admitting: Internal Medicine

## 2016-02-11 ENCOUNTER — Encounter: Payer: Self-pay | Admitting: Internal Medicine

## 2016-02-11 ENCOUNTER — Ambulatory Visit (INDEPENDENT_AMBULATORY_CARE_PROVIDER_SITE_OTHER): Payer: Self-pay | Admitting: Internal Medicine

## 2016-02-11 VITALS — BP 136/91 | HR 101 | Temp 99.0°F | Ht 65.0 in | Wt 190.5 lb

## 2016-02-11 DIAGNOSIS — A539 Syphilis, unspecified: Secondary | ICD-10-CM

## 2016-02-11 DIAGNOSIS — Z113 Encounter for screening for infections with a predominantly sexual mode of transmission: Secondary | ICD-10-CM

## 2016-02-11 DIAGNOSIS — Z23 Encounter for immunization: Secondary | ICD-10-CM

## 2016-02-11 DIAGNOSIS — B20 Human immunodeficiency virus [HIV] disease: Secondary | ICD-10-CM

## 2016-02-11 NOTE — Assessment & Plan Note (Signed)
Will recheck rpr today.

## 2016-02-11 NOTE — Progress Notes (Signed)
   Subjective:    Patient ID: Eduardo Leon, male    DOB: Jul 17, 1985, 30 y.o.   MRN: [id number]  HPI Here for follow up of HIV.   Has remained on Stribild and denies any missed doses.  No complaints.  CD4 520 and viral load undetectable prior to this visit. No weight loss.  Recently ended a long-term relationship with his previous partner and is requesting STI testing.  No penile discharge, no warts, no ulcers.    Review of Systems  Constitutional: Negative for fatigue.  HENT: Negative for trouble swallowing.   Gastrointestinal: Negative for diarrhea and nausea.  Skin: Negative for rash.  Neurological: Negative for dizziness.       Objective:   Physical Exam  Constitutional: He appears well-developed and well-nourished. No distress.  HENT:  Mouth/Throat: No oropharyngeal exudate.  Eyes: No scleral icterus.  Cardiovascular: Normal rate, regular rhythm and normal heart sounds.   No murmur heard. Skin: No rash noted.   Social History   Social History  . Marital status: Single    Spouse name: N/A  . Number of children: N/A  . Years of education: N/A   Occupational History  . Not on file.   Social History Main Topics  . Smoking status: Never Smoker  . Smokeless tobacco: Never Used  . Alcohol use No  . Drug use: No  . Sexual activity: Yes    Partners: Male    Birth control/ protection: Condom     Comment: accepted condoms   Other Topics Concern  . Not on file   Social History Narrative   Currently unemployed. has attended [employer] (but not completed).  Could not finish school due to migraines. Lives alone   No current stable partner       Assessment & Plan:

## 2016-02-11 NOTE — Assessment & Plan Note (Signed)
Will screen today and next visit again

## 2016-02-11 NOTE — Assessment & Plan Note (Signed)
Doing well.  rtc 6 months.  I will also change Stribild to [employer]

## 2016-02-12 LAB — URINE CYTOLOGY ANCILLARY ONLY
CHLAMYDIA, DNA PROBE: NEGATIVE
NEISSERIA GONORRHEA: NEGATIVE

## 2016-02-12 LAB — CYTOLOGY, (ORAL, ANAL, URETHRAL) ANCILLARY ONLY
Chlamydia: NEGATIVE
Chlamydia: NEGATIVE
NEISSERIA GONORRHEA: NEGATIVE
Neisseria Gonorrhea: NEGATIVE

## 2016-02-12 LAB — RPR

## 2016-10-27 ENCOUNTER — Telehealth: Payer: Self-pay | Admitting: *Deleted

## 2016-10-27 NOTE — Telephone Encounter (Signed)
Patient called requesting appt with MD. He thought his MD was Dr. Luciana Axeomer, however he was not sure. Advised that I could schedule him for a lab appt this week and MD appt in August. He asked if there was another MD he could see and I told him there was not. Asked if he was having an issue and he said, "yes I think I was exposed to an STD". Advised patient he should go [facility] testing and possible treatment. He will come for labs tomorrow and I scheduled him with Dr. Luciana Axeomer for 12/03/16. Eduardo MolaJacqueline Cockerham

## 2016-10-28 ENCOUNTER — Other Ambulatory Visit (HOSPITAL_COMMUNITY)
Admission: RE | Admit: 2016-10-28 | Discharge: 2016-10-28 | Disposition: A | Discharge status: Home / Self Care | Payer: Managed Care, Other (non HMO) | Source: Ambulatory Visit | Attending: Internal Medicine | Admitting: Internal Medicine

## 2016-10-28 ENCOUNTER — Other Ambulatory Visit: Payer: Managed Care, Other (non HMO)

## 2016-10-28 ENCOUNTER — Ambulatory Visit: Payer: Managed Care, Other (non HMO)

## 2016-10-28 ENCOUNTER — Ambulatory Visit (HOSPITAL_COMMUNITY)
Admission: EM | Admit: 2016-10-28 | Discharge: 2016-10-28 | Disposition: A | Discharge status: Home / Self Care | Payer: Managed Care, Other (non HMO)

## 2016-10-28 DIAGNOSIS — Z113 Encounter for screening for infections with a predominantly sexual mode of transmission: Secondary | ICD-10-CM | POA: Insufficient documentation

## 2016-10-28 DIAGNOSIS — B20 Human immunodeficiency virus [HIV] disease: Secondary | ICD-10-CM

## 2016-10-29 LAB — T-HELPER CELL (CD4) - (RCID CLINIC ONLY)
CD4 T CELL HELPER: 21 % — AB (ref 33–55)
CD4 T Cell Abs: 660 /uL (ref 400–2700)

## 2016-10-30 ENCOUNTER — Telehealth: Payer: Self-pay | Admitting: *Deleted

## 2016-10-30 LAB — URINE CYTOLOGY ANCILLARY ONLY
CHLAMYDIA, DNA PROBE: NEGATIVE
Neisseria Gonorrhea: NEGATIVE

## 2016-10-30 LAB — RPR: RPR Ser Ql: REACTIVE — AB

## 2016-10-30 LAB — HIV-1 RNA QUANT-NO REFLEX-BLD
HIV 1 RNA Quant: <20 copies/mL — AB
HIV-1 RNA Quant, Log: <1.3 Log copies/mL — AB

## 2016-10-30 LAB — RPR TITER

## 2016-10-30 LAB — FLUORESCENT TREPONEMAL AB(FTA)-IGG-BLD: FLUORESCENT TREPONEMAL ABS: REACTIVE — AB

## 2016-10-30 NOTE — Telephone Encounter (Signed)
Called patient and left a voice mail asking him to return the call for test results. Please see previous phone note and ask patient if he went to the [facility] for treatment. Wendall MolaJacqueline Cockerham

## 2016-10-30 NOTE — Telephone Encounter (Signed)
-----   Message from Gardiner Barefootobert W Comer, MD sent at 10/30/2016 10:49 AM EDT ----- He is syphilis positive and needs Bicillin x 1 thanks

## 2016-10-31 ENCOUNTER — Ambulatory Visit: Payer: Self-pay

## 2016-11-03 NOTE — Telephone Encounter (Addendum)
Patient returned call and I asked him if he had gone to the health department as we had previously discussed when he called last week. Patient stated that he called the health department and was told he needed an appointment to come in for treatment. He said when he called the next day there was no answer. He is positive for syphilis and he will come in tomorrow for treatment at 9:45 AM. I asked if he could come today and he said no he is already at work. I did advise the patient that his positive result is automatically reported to the health department. Wendall MolaJacqueline Jessia Kief

## 2016-11-04 ENCOUNTER — Ambulatory Visit (INDEPENDENT_AMBULATORY_CARE_PROVIDER_SITE_OTHER): Payer: Managed Care, Other (non HMO) | Admitting: *Deleted

## 2016-11-04 DIAGNOSIS — A539 Syphilis, unspecified: Secondary | ICD-10-CM | POA: Diagnosis not present

## 2016-11-04 MED ORDER — PENICILLIN G BENZATHINE 1200000 UNIT/2ML IM SUSP
1.2000 10*6.[IU] | Freq: Once | INTRAMUSCULAR | Status: AC
  Administered 2016-11-04: 1.2 10*6.[IU] via INTRAMUSCULAR

## 2016-11-12 ENCOUNTER — Telehealth: Payer: Self-pay | Admitting: *Deleted

## 2016-11-12 NOTE — Telephone Encounter (Signed)
Patient called to ask 2 questions. 1. The patient has seafood allergie and twice this year someone at work has had it for lunch and he has had a reaction. He was wondering if he could get an Rx for the Epi Pen just to have just in case?  2. He has extremely flat feet which are painful daily due to the fact that he has to wear hard bottom shoes to work and walks a lot. He was wondering if he could have note for work to say that he needs to wear (nice) tennis shoes as he has tried insoles and nothing helps.   Advised the patient will ask the provider and get back to him as soon as he responds.

## 2016-11-13 NOTE — Telephone Encounter (Signed)
Sounds like issues he needs to discuss with his PCP.  I am not familiar with the appropriateness of the epi pen for the reaction or what the reaction was - needs to be evaluated further.   For number 2 , he should talk to his employer and work it out.  If the employer needs documentation for this, they will need to contact us with their documentation requirements.

## 2016-12-03 ENCOUNTER — Ambulatory Visit: Payer: Self-pay | Admitting: Internal Medicine

## 2018-01-04 ENCOUNTER — Other Ambulatory Visit (HOSPITAL_COMMUNITY)
Admission: RE | Admit: 2018-01-04 | Discharge: 2018-01-04 | Disposition: A | Discharge status: Home / Self Care | Payer: Managed Care, Other (non HMO) | Source: Ambulatory Visit | Attending: Internal Medicine | Admitting: Internal Medicine

## 2018-01-04 ENCOUNTER — Other Ambulatory Visit: Payer: Managed Care, Other (non HMO)

## 2018-01-04 ENCOUNTER — Other Ambulatory Visit: Payer: Self-pay | Admitting: Behavioral Health

## 2018-01-04 DIAGNOSIS — B20 Human immunodeficiency virus [HIV] disease: Secondary | ICD-10-CM

## 2018-01-04 DIAGNOSIS — Z113 Encounter for screening for infections with a predominantly sexual mode of transmission: Secondary | ICD-10-CM

## 2018-01-04 DIAGNOSIS — Z79899 Other long term (current) drug therapy: Secondary | ICD-10-CM

## 2018-01-05 LAB — T-HELPER CELL (CD4) - (RCID CLINIC ONLY)
CD4 % Helper T Cell: 22 % — ABNORMAL LOW (ref 33–55)
CD4 T Cell Abs: 850 /uL (ref 400–2700)

## 2018-01-06 LAB — LIPID PANEL
CHOLESTEROL: 186 mg/dL (ref <200)
HDL: 34 mg/dL — AB (ref >40)
LDL CHOLESTEROL (CALC): 115 mg/dL — AB
Non-HDL Cholesterol (Calc): 152 mg/dL (calc) — ABNORMAL HIGH (ref <130)
Total CHOL/HDL Ratio: 5.5 (calc) — ABNORMAL HIGH (ref <5.0)
Triglycerides: 241 mg/dL — ABNORMAL HIGH (ref <150)

## 2018-01-06 LAB — FLUORESCENT TREPONEMAL AB(FTA)-IGG-BLD: Fluorescent Treponemal ABS: REACTIVE — AB

## 2018-01-06 LAB — CBC WITH DIFFERENTIAL/PLATELET
BASOS ABS: 58 {cells}/uL (ref 0–200)
Basophils Relative: 0.8 %
Eosinophils Absolute: 51 cells/uL (ref 15–500)
Eosinophils Relative: 0.7 %
HEMATOCRIT: 46.7 % (ref 38.5–50.0)
Hemoglobin: 16.5 g/dL (ref 13.2–17.1)
LYMPHS ABS: 3672 {cells}/uL (ref 850–3900)
MCH: 30.3 pg (ref 27.0–33.0)
MCHC: 35.3 g/dL (ref 32.0–36.0)
MCV: 85.8 fL (ref 80.0–100.0)
MPV: 9.1 fL (ref 7.5–12.5)
Monocytes Relative: 12.7 %
Neutro Abs: 2592 cells/uL (ref 1500–7800)
Neutrophils Relative %: 35.5 %
Platelets: 341 10*3/uL (ref 140–400)
RBC: 5.44 10*6/uL (ref 4.20–5.80)
RDW: 13.1 % (ref 11.0–15.0)
Total Lymphocyte: 50.3 %
WBC: 7.3 10*3/uL (ref 3.8–10.8)
WBCMIX: 927 {cells}/uL (ref 200–950)

## 2018-01-06 LAB — COMPREHENSIVE METABOLIC PANEL
AG RATIO: 1.3 (calc) (ref 1.0–2.5)
ALT: 42 U/L (ref 9–46)
AST: 25 U/L (ref 10–40)
Albumin: 4.4 g/dL (ref 3.6–5.1)
Alkaline phosphatase (APISO): 77 U/L (ref 40–115)
BILIRUBIN TOTAL: 0.4 mg/dL (ref 0.2–1.2)
BUN: 12 mg/dL (ref 7–25)
CALCIUM: 9.8 mg/dL (ref 8.6–10.3)
CO2: 30 mmol/L (ref 20–32)
Chloride: 102 mmol/L (ref 98–110)
Creat: 0.99 mg/dL (ref 0.60–1.35)
Globulin: 3.4 g/dL (calc) (ref 1.9–3.7)
Glucose, Bld: 98 mg/dL (ref 65–99)
POTASSIUM: 4.1 mmol/L (ref 3.5–5.3)
SODIUM: 139 mmol/L (ref 135–146)
TOTAL PROTEIN: 7.8 g/dL (ref 6.1–8.1)

## 2018-01-06 LAB — HIV-1 RNA QUANT-NO REFLEX-BLD
HIV 1 RNA QUANT: DETECTED {copies}/mL — AB
HIV-1 RNA Quant, Log: <1.3 Log copies/mL — AB

## 2018-01-06 LAB — URINE CYTOLOGY ANCILLARY ONLY
Chlamydia: NEGATIVE
Neisseria Gonorrhea: NEGATIVE

## 2018-01-06 LAB — RPR TITER: RPR Titer: 1:2 {titer} — ABNORMAL HIGH

## 2018-01-06 LAB — RPR: RPR Ser Ql: REACTIVE — AB

## 2018-01-18 ENCOUNTER — Encounter: Payer: Managed Care, Other (non HMO) | Admitting: Internal Medicine

## 2018-01-25 ENCOUNTER — Encounter: Payer: Self-pay | Admitting: Internal Medicine

## 2018-01-25 ENCOUNTER — Ambulatory Visit (INDEPENDENT_AMBULATORY_CARE_PROVIDER_SITE_OTHER): Payer: Managed Care, Other (non HMO) | Admitting: Internal Medicine

## 2018-01-25 VITALS — BP 142/82 | HR 68 | Temp 97.9°F | Wt 195.8 lb

## 2018-01-25 DIAGNOSIS — Z23 Encounter for immunization: Secondary | ICD-10-CM

## 2018-01-25 DIAGNOSIS — Z113 Encounter for screening for infections with a predominantly sexual mode of transmission: Secondary | ICD-10-CM | POA: Diagnosis not present

## 2018-01-25 DIAGNOSIS — Z79899 Other long term (current) drug therapy: Secondary | ICD-10-CM | POA: Diagnosis not present

## 2018-01-25 DIAGNOSIS — Z5181 Encounter for therapeutic drug level monitoring: Secondary | ICD-10-CM | POA: Insufficient documentation

## 2018-01-25 DIAGNOSIS — B20 Human immunodeficiency virus [HIV] disease: Secondary | ICD-10-CM

## 2018-01-25 MED ORDER — BICTEGRAVIR-EMTRICITAB-TENOFOV 50-200-25 MG PO TABS: 1.0000 | ORAL_TABLET | Freq: Every day | ORAL | 11 refills | Status: DC

## 2018-01-25 NOTE — Progress Notes (Signed)
   Subjective:    Patient ID: Eduardo Leon, male    DOB: 05/27/1985, 32 y.o.   MRN: [id number]  HPI Here for follow up of HIV He has not been seen in 2 years but has remained on Stribild.  No missed doses and did not run out of refills.  CD4 of 850 and viral load remains suppressed.  Normal creat, LFTs.  Here with a new partner who is HIV negative on PrEP. He also has some questions.  No associated n/v/d.  No weight loss.    Review of Systems  Constitutional: Negative for fatigue.  Gastrointestinal: Negative for diarrhea and nausea.  Skin: Negative for rash.  Neurological: Negative for dizziness.       Objective:   Physical Exam  Constitutional: He appears well-developed and well-nourished. No distress.  HENT:  Mouth/Throat: No oropharyngeal exudate.  Eyes: No scleral icterus.  Cardiovascular: Normal rate, regular rhythm and normal heart sounds.  No murmur heard. Pulmonary/Chest: Effort normal and breath sounds normal. No respiratory distress.  Skin: No rash noted.   SH: no tobacco       Assessment & Plan:

## 2018-01-25 NOTE — Assessment & Plan Note (Signed)
Creat wnl on TDF.  Now changing to TAF as above.

## 2018-01-25 NOTE — Assessment & Plan Note (Signed)
Screened negative.  Previous history of syphilis and treated.  Titers decreasing

## 2018-01-25 NOTE — Assessment & Plan Note (Signed)
Doing well and will change to Biktarvy.   rtc 1 year.

## 2018-04-28 ENCOUNTER — Emergency Department (HOSPITAL_COMMUNITY)
Admission: EM | Admit: 2018-04-28 | Discharge: 2018-04-28 | Disposition: A | Discharge status: Home / Self Care | Payer: Managed Care, Other (non HMO) | Attending: Emergency Medicine | Admitting: Emergency Medicine

## 2018-04-28 ENCOUNTER — Encounter (HOSPITAL_COMMUNITY): Payer: Self-pay | Admitting: Emergency Medicine

## 2018-04-28 ENCOUNTER — Other Ambulatory Visit: Payer: Self-pay

## 2018-04-28 DIAGNOSIS — R110 Nausea: Secondary | ICD-10-CM | POA: Insufficient documentation

## 2018-04-28 DIAGNOSIS — R197 Diarrhea, unspecified: Secondary | ICD-10-CM | POA: Diagnosis not present

## 2018-04-28 DIAGNOSIS — Z79899 Other long term (current) drug therapy: Secondary | ICD-10-CM | POA: Insufficient documentation

## 2018-04-28 DIAGNOSIS — R1084 Generalized abdominal pain: Secondary | ICD-10-CM | POA: Insufficient documentation

## 2018-04-28 DIAGNOSIS — Z21 Asymptomatic human immunodeficiency virus [HIV] infection status: Secondary | ICD-10-CM | POA: Diagnosis not present

## 2018-04-28 LAB — URINALYSIS, ROUTINE W REFLEX MICROSCOPIC
Bacteria, UA: NONE SEEN
Bilirubin Urine: NEGATIVE
GLUCOSE, UA: NEGATIVE mg/dL
Hgb urine dipstick: NEGATIVE
Ketones, ur: 5 mg/dL — AB
Leukocytes, UA: NEGATIVE
Nitrite: NEGATIVE
Protein, ur: 30 mg/dL — AB
SPECIFIC GRAVITY, URINE: 1.029 (ref 1.005–1.030)
pH: 5 (ref 5.0–8.0)

## 2018-04-28 LAB — CBC WITH DIFFERENTIAL/PLATELET
Abs Immature Granulocytes: 0.04 10*3/uL (ref 0.00–0.07)
BASOS PCT: 0 %
Basophils Absolute: 0 10*3/uL (ref 0.0–0.1)
EOS ABS: 0 10*3/uL (ref 0.0–0.5)
Eosinophils Relative: 0 %
HCT: 48.8 % (ref 39.0–52.0)
Hemoglobin: 16.3 g/dL (ref 13.0–17.0)
Immature Granulocytes: 0 %
Lymphocytes Relative: 21 %
Lymphs Abs: 2.5 10*3/uL (ref 0.7–4.0)
MCH: 30.8 pg (ref 26.0–34.0)
MCHC: 33.4 g/dL (ref 30.0–36.0)
MCV: 92.1 fL (ref 80.0–100.0)
Monocytes Absolute: 0.7 10*3/uL (ref 0.1–1.0)
Monocytes Relative: 6 %
Neutro Abs: 8.8 10*3/uL — ABNORMAL HIGH (ref 1.7–7.7)
Neutrophils Relative %: 73 %
PLATELETS: 326 10*3/uL (ref 150–400)
RBC: 5.3 MIL/uL (ref 4.22–5.81)
RDW: 12.6 % (ref 11.5–15.5)
WBC: 12 10*3/uL — ABNORMAL HIGH (ref 4.0–10.5)
nRBC: 0 % (ref 0.0–0.2)

## 2018-04-28 LAB — COMPREHENSIVE METABOLIC PANEL
ALT: 43 U/L (ref 0–44)
ANION GAP: 7 (ref 5–15)
AST: 24 U/L (ref 15–41)
Albumin: 4.2 g/dL (ref 3.5–5.0)
Alkaline Phosphatase: 81 U/L (ref 38–126)
BUN: 10 mg/dL (ref 6–20)
CO2: 25 mmol/L (ref 22–32)
Calcium: 9.1 mg/dL (ref 8.9–10.3)
Chloride: 105 mmol/L (ref 98–111)
Creatinine, Ser: 1.11 mg/dL (ref 0.61–1.24)
GFR calc Af Amer: >60 mL/min (ref >60)
GFR calc non Af Amer: >60 mL/min (ref >60)
Glucose, Bld: 137 mg/dL — ABNORMAL HIGH (ref 70–99)
Potassium: 4.4 mmol/L (ref 3.5–5.1)
Sodium: 137 mmol/L (ref 135–145)
Total Bilirubin: 0.5 mg/dL (ref 0.3–1.2)
Total Protein: 8.4 g/dL — ABNORMAL HIGH (ref 6.5–8.1)

## 2018-04-28 LAB — LIPASE, BLOOD: Lipase: 29 U/L (ref 11–51)

## 2018-04-28 MED ORDER — DICYCLOMINE HCL 10 MG PO CAPS
10.0000 mg | ORAL_CAPSULE | Freq: Once | ORAL | Status: AC
  Administered 2018-04-28: 10 mg via ORAL
  Filled 2018-04-28: qty 1

## 2018-04-28 MED ORDER — ONDANSETRON 4 MG PO TBDP
4.0000 mg | ORAL_TABLET | Freq: Once | ORAL | Status: AC
  Administered 2018-04-28: 4 mg via ORAL
  Filled 2018-04-28: qty 1

## 2018-04-28 MED ORDER — FENTANYL CITRATE (PF) 100 MCG/2ML IJ SOLN
50.0000 ug | Freq: Once | INTRAMUSCULAR | Status: AC
  Administered 2018-04-28: 50 ug via INTRAVENOUS
  Filled 2018-04-28: qty 2

## 2018-04-28 MED ORDER — SODIUM CHLORIDE 0.9 % IV BOLUS
1000.0000 mL | Freq: Once | INTRAVENOUS | Status: AC
  Administered 2018-04-28: 1000 mL via INTRAVENOUS

## 2018-04-28 NOTE — ED Triage Notes (Signed)
Pt arriving with abdominal pain, emesis,and diarrhea x2 days

## 2018-04-28 NOTE — Discharge Instructions (Addendum)
Please continue to hydrate with plenty of fluids and Gatorade.  If you experience any worsening symptoms, worsening pain or nausea you may return to the ED for reevaluation.  Advised please follow-up with your primary care physician and infectious disease doctor as needed.

## 2018-04-28 NOTE — ED Provider Notes (Signed)
[facility]-EMERGENCY DEPT Provider Note   CSN: [id number] Arrival date & time: 04/28/18  0540     History   Chief Complaint Chief Complaint  Patient presents with  . Abdominal Pain  . Emesis    HPI Eduardo Leon is a 33 y.o. male.  33 y.o male with a PMH of Anxiety presents to the ED with a chief complaint of abdominal x 3 days. Patient reports a sharp epigastric pain with no radiation along with multiple episodes of diarrhea on Monday. He reports the diarrhea had improved and has had normal bowel movements since with his last one being this morning prior to arrival. He reports taking some Gingerale but states no improvement in symptoms. He also endorses nausea. Patient also had chinese food yesterday and states this made his symptoms worsen. He denies any vomiting, recent travel, fever, urinary complaints, chest pain or shortness of breath.      Past Medical History:  Diagnosis Date  . Anxiety   . Depression    After HIV Diagnosis, h/o SI; has been on Cymbalta with adverse rxns  . Fibromyalgia   . HIV (human immunodeficiency virus infection) [facility](HCC) Aug 2012   Contracted sexually  . Migraines    Mgmt with OTC meds    Patient Active Problem List   Diagnosis Date Noted  . Medication monitoring encounter 01/25/2018  . Screening examination for venereal disease 06/25/2015  . Encounter for long-term (current) use of medications 06/25/2015  . Syphilis in male 01/18/2014  . Hemorrhoids, internal, with bleeding 08/02/2013  . Depression   . Human immunodeficiency virus (HIV) disease (HCC) 02/12/2010    Past Surgical History:  Procedure Laterality Date  . IRRIGATION AND DEBRIDEMENT ABSCESS  09/10/2011   Procedure: IRRIGATION AND DEBRIDEMENT ABSCESS;  Surgeon: Valarie MerinoMatthew B Martin, MD;  [facility];  Service: General;  Laterality: N/A;        Home Medications    Prior to Admission medications   Medication Sig Start Date End Date Taking? Authorizing  Provider  elvitegravir-cobicistat-emtricitabine-tenofovir (STRIBILD) 150-150-200-300 MG TABS tablet Take 1 tablet by mouth daily with breakfast.   Yes [provider]  bictegravir-emtricitabine-tenofovir AF (BIKTARVY) 50-200-25 MG TABS tablet Take 1 tablet by mouth daily. Patient not taking: Reported on 04/28/2018 01/25/18   Gardiner Barefootomer, Robert W, MD    Family History Family History  Problem Relation Age of Onset  . Hypertension Father   . Diabetes Other        Mother & Father sides of family    Social History Social History   Tobacco Use  . Smoking status: Never Smoker  . Smokeless tobacco: Never Used  Substance Use Topics  . Alcohol use: No    Alcohol/week: 0.0 standard drinks  . Drug use: No     Allergies   Shellfish-derived products; Codeine; Hydrocodone; and Vancomycin   Review of Systems Review of Systems  Constitutional: Negative for chills and fever.  HENT: Negative for ear pain and sore throat.   Eyes: Negative for pain and visual disturbance.  Respiratory: Negative for cough and shortness of breath.   Cardiovascular: Negative for chest pain and palpitations.  Gastrointestinal: Positive for abdominal pain and nausea. Negative for vomiting.  Genitourinary: Negative for dysuria and hematuria.  Musculoskeletal: Negative for arthralgias and back pain.  Skin: Negative for color change and rash.  Neurological: Negative for seizures and syncope.  All other systems reviewed and are negative.    Physical Exam Updated Vital Signs BP (!) 107/59 (BP  Location: Left Arm)   Pulse 70   Temp 98.4 F (36.9 C) (Oral)   Resp 16   Ht 5\' 6"  (1.676 m)   Wt 83.9 kg   SpO2 97%   BMI 29.86 kg/m   Physical Exam Vitals signs and nursing note reviewed.  Constitutional:      Appearance: He is well-developed.  HENT:     Head: Normocephalic and atraumatic.  Eyes:     General: No scleral icterus.    Pupils: Pupils are equal, round, and reactive to light.  Neck:      Musculoskeletal: Normal range of motion.  Cardiovascular:     Heart sounds: Normal heart sounds.  Pulmonary:     Effort: Pulmonary effort is normal.     Breath sounds: Normal breath sounds. No wheezing.  Chest:     Chest wall: No tenderness.  Abdominal:     General: Bowel sounds are normal. There is no distension.     Palpations: Abdomen is soft.     Tenderness: There is abdominal tenderness in the right lower quadrant and epigastric area. There is no right CVA tenderness or left CVA tenderness.  Musculoskeletal:        General: No tenderness or deformity.  Skin:    General: Skin is warm and dry.  Neurological:     Mental Status: He is alert and oriented to person, place, and time.      ED Treatments / Results  Labs (all labs ordered are listed, but only abnormal results are displayed) Labs Reviewed  COMPREHENSIVE METABOLIC PANEL - Abnormal; Notable for the following components:      Result Value   Glucose, Bld 137 (*)    Total Protein 8.4 (*)    All other components within normal limits  CBC WITH DIFFERENTIAL/PLATELET - Abnormal; Notable for the following components:   WBC 12.0 (*)    Neutro Abs 8.8 (*)    All other components within normal limits  URINALYSIS, ROUTINE W REFLEX MICROSCOPIC - Abnormal; Notable for the following components:   Ketones, ur 5 (*)    Protein, ur 30 (*)    All other components within normal limits  LIPASE, BLOOD  T-HELPER CELLS (CD4) COUNT (NOT AT [facility])    EKG None  Radiology No results found.  Procedures Procedures (including critical care time)  Medications Ordered in ED Medications  dicyclomine (BENTYL) capsule 10 mg (10 mg Oral Given 04/28/18 0651)  ondansetron (ZOFRAN-ODT) disintegrating tablet 4 mg (4 mg Oral Given 04/28/18 0651)  sodium chloride 0.9 % bolus 1,000 mL (0 mLs Intravenous Stopped 04/28/18 1000)  fentaNYL (SUBLIMAZE) injection 50 mcg (50 mcg Intravenous Given 04/28/18 0754)     Initial Impression / Assessment and Plan  / ED Course  I have reviewed the triage vital signs and the nursing notes.  Pertinent labs & imaging results that were available during my care of the patient were reviewed by me and considered in my medical decision making (see chart for details).    With abdominal pain since Sunday, reports generalized pain with no focal point of tenderness.  CMP showed no electrolyte abnormality, creatinine is within normal limits.  CBC showed slight recent white blood cell count, hemoglobin stable.  UA showed no nitrites, leukocytes, 6-10 white blood cell count denies any urinary symptoms at this time.  Lipase was within normal limits.  Patient received some fentanyl 50 mics along with a bolus of saline and reports improvement in symptoms.  He is currently immunocompromise but  reports following up with his infectious disease on a regular basis. Will Obtain CD4 count.  Patient's vitals have been stable he is currently resting comfortably states improvement after bolus along with pain medication.  Nausea has resolved low suspicion for any appendicitis, diverticulitis patient pain has improved with medication and he denies any diarrhea episodes today.  Both of his diarrhea episodes had no blood in them.  No point of tenderness during abdominal evaluation.  He denies any cough or cold symptoms.  At this time no further imaging was necessary as patient is stable and pain-free.  Some suspicion for viral process after eating [place] food over the weekend.  No stable during ED visit, patient stable for discharge.  Final Clinical Impressions(s) / ED Diagnoses   Final diagnoses:  Diarrhea, unspecified type  Generalized abdominal pain    ED Discharge Orders    None       Claude MangesSoto, Azlaan Isidore, PA-C 04/28/18 1049    Molpus, John, MD 04/28/18 2236

## 2018-04-29 LAB — T-HELPER CELLS (CD4) COUNT (NOT AT ARMC)
CD4 % Helper T Cell: 13 % — ABNORMAL LOW (ref 33–55)
CD4 T Cell Abs: 330 /uL — ABNORMAL LOW (ref 400–2700)

## 2018-11-17 ENCOUNTER — Encounter (HOSPITAL_COMMUNITY): Payer: Self-pay

## 2018-11-17 ENCOUNTER — Other Ambulatory Visit: Payer: Self-pay

## 2018-11-17 ENCOUNTER — Ambulatory Visit (HOSPITAL_COMMUNITY)
Admission: EM | Admit: 2018-11-17 | Discharge: 2018-11-17 | Disposition: A | Discharge status: Home / Self Care | Payer: Managed Care, Other (non HMO) | Attending: Urgent Care | Admitting: Urgent Care

## 2018-11-17 DIAGNOSIS — R3989 Other symptoms and signs involving the genitourinary system: Secondary | ICD-10-CM | POA: Diagnosis not present

## 2018-11-17 DIAGNOSIS — Z8619 Personal history of other infectious and parasitic diseases: Secondary | ICD-10-CM | POA: Diagnosis not present

## 2018-11-17 DIAGNOSIS — Z7252 High risk homosexual behavior: Secondary | ICD-10-CM | POA: Diagnosis not present

## 2018-11-17 DIAGNOSIS — B20 Human immunodeficiency virus [HIV] disease: Secondary | ICD-10-CM

## 2018-11-17 DIAGNOSIS — Z7251 High risk heterosexual behavior: Secondary | ICD-10-CM

## 2018-11-17 MED ORDER — AZITHROMYCIN 250 MG PO TABS
1000.0000 mg | ORAL_TABLET | Freq: Once | ORAL | Status: AC
  Administered 2018-11-17: 1000 mg via ORAL

## 2018-11-17 MED ORDER — CEFTRIAXONE SODIUM 250 MG IJ SOLR
INTRAMUSCULAR | Status: AC
  Filled 2018-11-17: qty 250

## 2018-11-17 MED ORDER — PENICILLIN G BENZATHINE 1200000 UNIT/2ML IM SUSP
INTRAMUSCULAR | Status: AC
  Filled 2018-11-17: qty 2

## 2018-11-17 MED ORDER — CEFTRIAXONE SODIUM 250 MG IJ SOLR
250.0000 mg | Freq: Once | INTRAMUSCULAR | Status: AC
  Administered 2018-11-17: 250 mg via INTRAMUSCULAR

## 2018-11-17 MED ORDER — PENICILLIN G BENZATHINE 1200000 UNIT/2ML IM SUSP
2.4000 10*6.[IU] | Freq: Once | INTRAMUSCULAR | Status: AC
  Administered 2018-11-17: 2.4 10*6.[IU] via INTRAMUSCULAR

## 2018-11-17 MED ORDER — AZITHROMYCIN 250 MG PO TABS
ORAL_TABLET | ORAL | Status: AC
  Filled 2018-11-17: qty 4

## 2018-11-17 NOTE — ED Triage Notes (Signed)
Pt states he believes has been exposed to STD , Syphilis pt states he has a bump that opened up and fluid came out .

## 2018-11-17 NOTE — ED Provider Notes (Signed)
MRN: [id number] DOB: 04-08-1986  Subjective:   Eduardo Leon is a 33 y.o. male presenting for 1 week history of recurrent painless sore over his right groin area.  Patient states that it drained over the weekend but has since been dry.  Patient has a history of syphilis, states that it presented the exact same way.  Has a history of HIV and is currently undergoing treatment for this.  Patient has 1 sex partner, male.  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   No current facility-administered medications for this encounter.   Current Outpatient Medications:  .  bictegravir-emtricitabine-tenofovir AF (BIKTARVY) 50-200-25 MG TABS tablet, Take 1 tablet by mouth daily. (Patient not taking: Reported on 04/28/2018), Disp: 30 tablet, Rfl: 11 .  elvitegravir-cobicistat-emtricitabine-tenofovir (STRIBILD) 150-150-200-300 MG TABS tablet, Take 1 tablet by mouth daily with breakfast., Disp: , Rfl:    Allergies  Allergen Reactions  . Shellfish-Derived Products Anaphylaxis, Hives and Itching  . Codeine Hives  . Hydrocodone     Severe itching and rash on legs and back. 2mg  morphine charted in 2011 - reaction unknown  . Vancomycin     hives    Past Medical History:  Diagnosis Date  . Anxiety   . Depression    After HIV Diagnosis, h/o SI; has been on Cymbalta with adverse rxns  . Fibromyalgia   . HIV (human immunodeficiency virus infection) [facility]) Aug 2012   Contracted sexually  . Migraines    Mgmt with OTC meds     Past Surgical History:  Procedure Laterality Date  . IRRIGATION AND DEBRIDEMENT ABSCESS  09/10/2011   Procedure: IRRIGATION AND DEBRIDEMENT ABSCESS;  Surgeon: Pedro Earls, MD;  Location: [place];  Service: General;  Laterality: N/A;    ROS  Objective:   Vitals: BP (!) 142/88 (BP Location: Left Arm)   Pulse 65   Temp 97.9 F (36.6 C) (Temporal)   Resp 16   Wt 180 lb (81.6 kg)   SpO2 98%    BMI 29.05 kg/m   Physical Exam Constitutional:      Appearance: Normal appearance. He is well-developed and normal weight.  HENT:     Head: Normocephalic and atraumatic.     Right Ear: External ear normal.     Left Ear: External ear normal.     Nose: Nose normal.     Mouth/Throat:     Pharynx: Oropharynx is clear.  Eyes:     Extraocular Movements: Extraocular movements intact.     Pupils: Pupils are equal, round, and reactive to light.  Cardiovascular:     Rate and Rhythm: Normal rate.  Pulmonary:     Effort: Pulmonary effort is normal.  Genitourinary:    Penis: Circumcised. No phimosis, paraphimosis, hypospadias, erythema, tenderness, discharge, swelling or lesions.      Scrotum/Testes:        Right: Tenderness or swelling not present.        Left: Tenderness or swelling not present.     Epididymis:     Right: Not inflamed or enlarged. No mass or tenderness.     Left: Not inflamed or enlarged. No mass or tenderness.    Lymphadenopathy:     Lower Body: No right inguinal adenopathy. No left inguinal adenopathy.  Neurological:     Mental Status: He is alert and oriented to person, place, and time.  Psychiatric:  Mood and Affect: Mood normal.        Behavior: Behavior normal.    Assessment and Plan :   1. Genital sore   2. History of syphilis   3. HIV disease (HCC)   4. Unprotected sex     Patient is high risk, will treat empirically for syphilis and follow CDC guidelines for gonorrhea and chlamydia treatment as well.  Labs pending.  Recommended patient follow-up with infectious disease for his HIV. Counseled patient on potential for adverse effects with medications prescribed/recommended today, ER and return-to-clinic precautions discussed, patient verbalized understanding.    Wallis BambergMani, Mousa Prout, [place] JerseyPA-C 11/17/18 [phone]

## 2018-11-18 LAB — URINE CYTOLOGY ANCILLARY ONLY
Chlamydia: NEGATIVE
Neisseria Gonorrhea: NEGATIVE
Trichomonas: NEGATIVE

## 2018-11-18 LAB — RPR, QUANT+TP ABS (REFLEX)
Rapid Plasma Reagin, Quant: 1:2 {titer} — ABNORMAL HIGH
T Pallidum Abs: REACTIVE — AB

## 2018-11-18 LAB — RPR: RPR Ser Ql: REACTIVE — AB

## 2018-11-19 ENCOUNTER — Telehealth (HOSPITAL_COMMUNITY): Payer: Self-pay | Admitting: Emergency Medicine

## 2018-11-19 NOTE — Telephone Encounter (Signed)
This may be possible baseline for patient, pt had old labs in 2019 with similar titer. Pt however was symptomatic and was treated during visit with Penicillin. Patient contacted and made aware of  lab  results, all questions answered

## 2019-01-03 ENCOUNTER — Ambulatory Visit: Payer: Managed Care, Other (non HMO) | Admitting: Internal Medicine

## 2019-01-10 ENCOUNTER — Other Ambulatory Visit: Payer: Managed Care, Other (non HMO)

## 2019-01-24 ENCOUNTER — Other Ambulatory Visit: Payer: Self-pay | Admitting: Internal Medicine

## 2019-01-24 ENCOUNTER — Telehealth: Payer: Self-pay | Admitting: *Deleted

## 2019-01-24 DIAGNOSIS — B20 Human immunodeficiency virus [HIV] disease: Secondary | ICD-10-CM

## 2019-01-24 NOTE — Telephone Encounter (Signed)
Received refill request. Patient last seen 01/2018.  Needs office visit/labs for refill. RN sent in 30 day supply asking him to call for appointment.  Left message asking patient to call his doctor's office for appointment for additional refills. Landis Gandy, RN

## 2019-01-26 ENCOUNTER — Ambulatory Visit: Payer: Managed Care, Other (non HMO) | Admitting: Internal Medicine

## 2019-02-18 ENCOUNTER — Telehealth: Payer: Self-pay

## 2019-02-18 ENCOUNTER — Other Ambulatory Visit: Payer: Self-pay | Admitting: Internal Medicine

## 2019-02-18 DIAGNOSIS — B20 Human immunodeficiency virus [HIV] disease: Secondary | ICD-10-CM

## 2019-02-18 NOTE — Telephone Encounter (Signed)
Received medication refill. Pt was notified last month to schedule follow up appointment in order to receive additional refills.Called patient to let him know that the medication has been discontinued until he is able to make an appointment to see a provider for follow up. Left voicemail as the patient did not answer.   Noraa Pickeral Lorita Officer, RN

## 2019-03-03 ENCOUNTER — Other Ambulatory Visit (HOSPITAL_COMMUNITY)
Admission: RE | Admit: 2019-03-03 | Discharge: 2019-03-03 | Disposition: A | Discharge status: Home / Self Care | Payer: Managed Care, Other (non HMO) | Source: Ambulatory Visit | Attending: Internal Medicine | Admitting: Internal Medicine

## 2019-03-03 ENCOUNTER — Other Ambulatory Visit: Payer: Managed Care, Other (non HMO)

## 2019-03-03 ENCOUNTER — Other Ambulatory Visit: Payer: Self-pay

## 2019-03-03 DIAGNOSIS — Z113 Encounter for screening for infections with a predominantly sexual mode of transmission: Secondary | ICD-10-CM

## 2019-03-03 DIAGNOSIS — B20 Human immunodeficiency virus [HIV] disease: Secondary | ICD-10-CM | POA: Diagnosis not present

## 2019-03-03 DIAGNOSIS — Z79899 Other long term (current) drug therapy: Secondary | ICD-10-CM

## 2019-03-04 LAB — URINE CYTOLOGY ANCILLARY ONLY
Chlamydia: NEGATIVE
Comment: NEGATIVE
Comment: NORMAL
Neisseria Gonorrhea: NEGATIVE

## 2019-03-04 LAB — T-HELPER CELL (CD4) - (RCID CLINIC ONLY)
CD4 % Helper T Cell: 27 % — ABNORMAL LOW (ref 33–65)
CD4 T Cell Abs: 782 /uL (ref 400–1790)

## 2019-03-10 LAB — CBC WITH DIFFERENTIAL/PLATELET
Absolute Monocytes: 546 cells/uL (ref 200–950)
Basophils Absolute: 58 cells/uL (ref 0–200)
Basophils Relative: 1.1 %
Eosinophils Absolute: 21 cells/uL (ref 15–500)
Eosinophils Relative: 0.4 %
HCT: 47.6 % (ref 38.5–50.0)
Hemoglobin: 16.6 g/dL (ref 13.2–17.1)
Lymphs Abs: 3000 cells/uL (ref 850–3900)
MCH: 30.6 pg (ref 27.0–33.0)
MCHC: 34.9 g/dL (ref 32.0–36.0)
MCV: 87.7 fL (ref 80.0–100.0)
MPV: 9.1 fL (ref 7.5–12.5)
Monocytes Relative: 10.3 %
Neutro Abs: 1675 cells/uL (ref 1500–7800)
Neutrophils Relative %: 31.6 %
Platelets: 359 10*3/uL (ref 140–400)
RBC: 5.43 10*6/uL (ref 4.20–5.80)
RDW: 12.2 % (ref 11.0–15.0)
Total Lymphocyte: 56.6 %
WBC: 5.3 10*3/uL (ref 3.8–10.8)

## 2019-03-10 LAB — FLUORESCENT TREPONEMAL AB(FTA)-IGG-BLD: Fluorescent Treponemal ABS: REACTIVE — AB

## 2019-03-10 LAB — LIPID PANEL
Cholesterol: 185 mg/dL (ref <200)
HDL: 35 mg/dL — ABNORMAL LOW (ref >=40)
LDL Cholesterol (Calc): 125 mg/dL (calc) — ABNORMAL HIGH
Non-HDL Cholesterol (Calc): 150 mg/dL (calc) — ABNORMAL HIGH (ref <130)
Total CHOL/HDL Ratio: 5.3 (calc) — ABNORMAL HIGH (ref <5.0)
Triglycerides: 130 mg/dL (ref <150)

## 2019-03-10 LAB — RPR TITER: RPR Titer: 1:1 {titer} — ABNORMAL HIGH

## 2019-03-10 LAB — COMPLETE METABOLIC PANEL WITH GFR
AG Ratio: 1.4 (calc) (ref 1.0–2.5)
ALT: 50 U/L — ABNORMAL HIGH (ref 9–46)
AST: 26 U/L (ref 10–40)
Albumin: 4.5 g/dL (ref 3.6–5.1)
Alkaline phosphatase (APISO): 84 U/L (ref 36–130)
BUN: 10 mg/dL (ref 7–25)
CO2: 27 mmol/L (ref 20–32)
Calcium: 10 mg/dL (ref 8.6–10.3)
Chloride: 103 mmol/L (ref 98–110)
Creat: 1.22 mg/dL (ref 0.60–1.35)
GFR, Est African American: 89 mL/min/{1.73_m2} (ref >=60)
GFR, Est Non African American: 77 mL/min/{1.73_m2} (ref >=60)
Globulin: 3.2 g/dL (calc) (ref 1.9–3.7)
Glucose, Bld: 116 mg/dL — ABNORMAL HIGH (ref 65–99)
Potassium: 4.2 mmol/L (ref 3.5–5.3)
Sodium: 138 mmol/L (ref 135–146)
Total Bilirubin: 0.6 mg/dL (ref 0.2–1.2)
Total Protein: 7.7 g/dL (ref 6.1–8.1)

## 2019-03-10 LAB — HIV-1 RNA QUANT-NO REFLEX-BLD
HIV 1 RNA Quant: <20 copies/mL
HIV-1 RNA Quant, Log: <1.3 Log copies/mL

## 2019-03-10 LAB — RPR: RPR Ser Ql: REACTIVE — AB

## 2019-03-15 ENCOUNTER — Encounter (HOSPITAL_COMMUNITY): Payer: Self-pay

## 2019-03-15 ENCOUNTER — Emergency Department (HOSPITAL_COMMUNITY)
Admission: EM | Admit: 2019-03-15 | Discharge: 2019-03-15 | Disposition: A | Discharge status: Home / Self Care | Payer: Managed Care, Other (non HMO) | Attending: Emergency Medicine | Admitting: Emergency Medicine

## 2019-03-15 ENCOUNTER — Emergency Department (HOSPITAL_COMMUNITY)
Admission: EM | Admit: 2019-03-15 | Discharge: 2019-03-16 | Disposition: A | Discharge status: Home / Self Care | Payer: Managed Care, Other (non HMO) | Source: Home / Self Care | Attending: Emergency Medicine | Admitting: Emergency Medicine

## 2019-03-15 ENCOUNTER — Other Ambulatory Visit: Payer: Self-pay

## 2019-03-15 DIAGNOSIS — R109 Unspecified abdominal pain: Secondary | ICD-10-CM | POA: Insufficient documentation

## 2019-03-15 DIAGNOSIS — Z5321 Procedure and treatment not carried out due to patient leaving prior to being seen by health care provider: Secondary | ICD-10-CM | POA: Diagnosis not present

## 2019-03-15 DIAGNOSIS — N179 Acute kidney failure, unspecified: Secondary | ICD-10-CM

## 2019-03-15 DIAGNOSIS — R112 Nausea with vomiting, unspecified: Secondary | ICD-10-CM

## 2019-03-15 DIAGNOSIS — E860 Dehydration: Secondary | ICD-10-CM

## 2019-03-15 LAB — COMPREHENSIVE METABOLIC PANEL
ALT: 51 U/L — ABNORMAL HIGH (ref 0–44)
AST: 24 U/L (ref 15–41)
Albumin: 4.3 g/dL (ref 3.5–5.0)
Alkaline Phosphatase: 77 U/L (ref 38–126)
Anion gap: 8 (ref 5–15)
BUN: 12 mg/dL (ref 6–20)
CO2: 24 mmol/L (ref 22–32)
Calcium: 9.3 mg/dL (ref 8.9–10.3)
Chloride: 105 mmol/L (ref 98–111)
Creatinine, Ser: 1.2 mg/dL (ref 0.61–1.24)
GFR calc Af Amer: >60 mL/min (ref >60)
GFR calc non Af Amer: >60 mL/min (ref >60)
Glucose, Bld: 139 mg/dL — ABNORMAL HIGH (ref 70–99)
Potassium: 4 mmol/L (ref 3.5–5.1)
Sodium: 137 mmol/L (ref 135–145)
Total Bilirubin: 0.8 mg/dL (ref 0.3–1.2)
Total Protein: 8.1 g/dL (ref 6.5–8.1)

## 2019-03-15 LAB — CBC
HCT: 47.6 % (ref 39.0–52.0)
Hemoglobin: 15.9 g/dL (ref 13.0–17.0)
MCH: 31 pg (ref 26.0–34.0)
MCHC: 33.4 g/dL (ref 30.0–36.0)
MCV: 92.8 fL (ref 80.0–100.0)
Platelets: 306 10*3/uL (ref 150–400)
RBC: 5.13 MIL/uL (ref 4.22–5.81)
RDW: 12.8 % (ref 11.5–15.5)
WBC: 13.5 10*3/uL — ABNORMAL HIGH (ref 4.0–10.5)
nRBC: 0 % (ref 0.0–0.2)

## 2019-03-15 LAB — LIPASE, BLOOD: Lipase: 21 U/L (ref 11–51)

## 2019-03-15 MED ORDER — SODIUM CHLORIDE 0.9% FLUSH: 3.0000 mL | Freq: Once | INTRAVENOUS | Status: DC

## 2019-03-15 MED ORDER — SODIUM CHLORIDE 0.9% FLUSH
3.0000 mL | Freq: Once | INTRAVENOUS | Status: AC
  Administered 2019-03-16: 01:00:00 3 mL via INTRAVENOUS

## 2019-03-15 NOTE — ED Triage Notes (Signed)
Pt coming from home c/o abdominal pain. N/V/D x2 days. States it started after eating red meat which he usually doesn't do.

## 2019-03-15 NOTE — ED Triage Notes (Signed)
Per pt he has been having abdominal pain with nausea, vomiting, diarrhea x 2 days now. Pt says he is very weak. Pt has tenderness in the upper quadrant area. Pt had a fever with ems 100.8 was given 1000 mg tylenol and threw it up. Pt was also given 4mg  iv zofran. CBG 151 109/70 HR 18, RR 18

## 2019-03-16 LAB — URINALYSIS, ROUTINE W REFLEX MICROSCOPIC
Bacteria, UA: NONE SEEN
Bilirubin Urine: NEGATIVE
Glucose, UA: NEGATIVE mg/dL
Hgb urine dipstick: NEGATIVE
Ketones, ur: NEGATIVE mg/dL
Leukocytes,Ua: NEGATIVE
Nitrite: NEGATIVE
Protein, ur: 30 mg/dL — AB
Specific Gravity, Urine: 1.03 (ref 1.005–1.030)
pH: 5 (ref 5.0–8.0)

## 2019-03-16 LAB — CBC
HCT: 45.1 % (ref 39.0–52.0)
Hemoglobin: 15.5 g/dL (ref 13.0–17.0)
MCH: 31.2 pg (ref 26.0–34.0)
MCHC: 34.4 g/dL (ref 30.0–36.0)
MCV: 90.7 fL (ref 80.0–100.0)
Platelets: 274 10*3/uL (ref 150–400)
RBC: 4.97 MIL/uL (ref 4.22–5.81)
RDW: 12.9 % (ref 11.5–15.5)
WBC: 11.2 10*3/uL — ABNORMAL HIGH (ref 4.0–10.5)
nRBC: 0 % (ref 0.0–0.2)

## 2019-03-16 LAB — COMPREHENSIVE METABOLIC PANEL
ALT: 49 U/L — ABNORMAL HIGH (ref 0–44)
AST: 25 U/L (ref 15–41)
Albumin: 3.9 g/dL (ref 3.5–5.0)
Alkaline Phosphatase: 84 U/L (ref 38–126)
Anion gap: 12 (ref 5–15)
BUN: 13 mg/dL (ref 6–20)
CO2: 21 mmol/L — ABNORMAL LOW (ref 22–32)
Calcium: 9.2 mg/dL (ref 8.9–10.3)
Chloride: 105 mmol/L (ref 98–111)
Creatinine, Ser: 1.53 mg/dL — ABNORMAL HIGH (ref 0.61–1.24)
GFR calc Af Amer: >60 mL/min (ref >60)
GFR calc non Af Amer: 58 mL/min — ABNORMAL LOW (ref >60)
Glucose, Bld: 156 mg/dL — ABNORMAL HIGH (ref 70–99)
Potassium: 3.6 mmol/L (ref 3.5–5.1)
Sodium: 138 mmol/L (ref 135–145)
Total Bilirubin: 1.4 mg/dL — ABNORMAL HIGH (ref 0.3–1.2)
Total Protein: 7.6 g/dL (ref 6.5–8.1)

## 2019-03-16 LAB — INFLUENZA PANEL BY PCR (TYPE A & B)
Influenza A By PCR: NEGATIVE
Influenza B By PCR: NEGATIVE

## 2019-03-16 LAB — LIPASE, BLOOD: Lipase: 24 U/L (ref 11–51)

## 2019-03-16 LAB — POC SARS CORONAVIRUS 2 AG -  ED: SARS Coronavirus 2 Ag: NEGATIVE

## 2019-03-16 MED ORDER — SODIUM CHLORIDE 0.9 % IV BOLUS (SEPSIS)
1000.0000 mL | Freq: Once | INTRAVENOUS | Status: AC
  Administered 2019-03-16: 1000 mL via INTRAVENOUS

## 2019-03-16 MED ORDER — METOCLOPRAMIDE HCL 5 MG/ML IJ SOLN
10.0000 mg | Freq: Once | INTRAMUSCULAR | Status: AC
  Administered 2019-03-16: 10 mg via INTRAVENOUS
  Filled 2019-03-16: qty 2

## 2019-03-16 MED ORDER — ACETAMINOPHEN 500 MG PO TABS
1000.0000 mg | ORAL_TABLET | Freq: Once | ORAL | Status: AC
  Administered 2019-03-16: 1000 mg via ORAL
  Filled 2019-03-16: qty 2

## 2019-03-16 MED ORDER — SODIUM CHLORIDE 0.9 % IV BOLUS (SEPSIS)
1000.0000 mL | Freq: Once | INTRAVENOUS | Status: AC
  Administered 2019-03-16: 01:00:00 1000 mL via INTRAVENOUS

## 2019-03-16 MED ORDER — SODIUM CHLORIDE 0.9 % IV SOLN
1000.0000 mL | INTRAVENOUS | Status: DC
  Administered 2019-03-16: 1000 mL via INTRAVENOUS

## 2019-03-16 MED ORDER — ONDANSETRON 4 MG PO TBDP: 4.0000 mg | ORAL_TABLET | Freq: Three times a day (TID) | ORAL | 0 refills | Status: AC | PRN

## 2019-03-16 MED ORDER — ALUM & MAG HYDROXIDE-SIMETH 200-200-20 MG/5ML PO SUSP
30.0000 mL | Freq: Once | ORAL | Status: AC
  Administered 2019-03-16: 30 mL via ORAL
  Filled 2019-03-16: qty 30

## 2019-03-16 NOTE — ED Provider Notes (Signed)
[place] EMERGENCY DEPARTMENT Provider Note  CSN: [id number] Arrival date & time: 03/15/19 2310  Chief Complaint(s) Abdominal Pain  HPI Eduardo Leon is a 33 y.o. male with a past medical history listed below including HIV on antiretrovirals with nondetectable quant recently who presents to the emergency department with 2 days of generalized abdominal discomfort, nausea, nonbloody nonbilious emesis, and diarrhea.  Patient reports possible suspicious food intake stating that he ate red meat for the first time in a while on Sunday.  He denies any known sick contacts.  No change in medication.  Abdominal discomfort has been constant since onset.  Worse with vomiting.  No alleviating factors.  Patient has tried taking Pepto-Bismol for the symptoms with minimal relief.  Denies any other physical complaints.  HPI  Past Medical History Past Medical History:  Diagnosis Date   Anxiety    Depression    After HIV Diagnosis, h/o SI; has been on Cymbalta with adverse rxns   Fibromyalgia    HIV (human immunodeficiency virus infection) ([place]) Aug 2012   Contracted sexually   Migraines    Mgmt with OTC meds   Patient Active Problem List   Diagnosis Date Noted   Medication monitoring encounter 01/25/2018   Screening examination for venereal disease 06/25/2015   Encounter for long-term (current) use of medications 06/25/2015   Syphilis in male 01/18/2014   Hemorrhoids, internal, with bleeding 08/02/2013   Depression    Human immunodeficiency virus (HIV) disease ([place]) 02/12/2010   Home Medication(s) Prior to Admission medications   Medication Sig Start Date End Date Taking? Authorizing Provider  bictegravir-emtricitabine-tenofovir AF (BIKTARVY) 50-200-25 MG TABS tablet Take 1 tablet by mouth daily. Please call to schedule your annual appointment 01/24/19   Comer, Okey Regal, MD  elvitegravir-cobicistat-emtricitabine-tenofovir (STRIBILD) 150-150-200-300 MG TABS  tablet Take 1 tablet by mouth daily with breakfast.    [provider]  ondansetron (ZOFRAN ODT) 4 MG disintegrating tablet Take 1 tablet (4 mg total) by mouth every 8 (eight) hours as needed for up to 3 days for nausea or vomiting. 03/16/19 03/19/19  Fatima Blank, MD                                                                                                                                    Past Surgical History Past Surgical History:  Procedure Laterality Date   IRRIGATION AND DEBRIDEMENT ABSCESS  09/10/2011   Procedure: IRRIGATION AND DEBRIDEMENT ABSCESS;  Surgeon: Gareth Fitzner Earls, MD;  Location: [place];  Service: General;  Laterality: N/A;   Family History Family History  Problem Relation Age of Onset   Hypertension Father    Diabetes Other        Mother & Father sides of family    Social History Social History   Tobacco Use   Smoking status: Never Smoker   Smokeless tobacco: Never Used  Substance Use Topics   Alcohol use:  No    Alcohol/week: 0.0 standard drinks   Drug use: No   Allergies Shellfish-derived products, Codeine, Hydrocodone, and Vancomycin  Review of Systems Review of Systems All other systems are reviewed and are negative for acute change except as noted in the HPI  Physical Exam Vital Signs  I have reviewed the triage vital signs BP (!) 84/48 (BP Location: Right Arm)    Pulse (!) 110    Temp 99.8 F (37.7 C) (Oral)    Resp 18    Ht  (1.676 m)    Wt 90.7 kg    SpO2 97%    BMI 32.28 kg/m   Physical Exam Vitals signs reviewed.  Constitutional:      General: He is not in acute distress.    Appearance: He is well-developed. He is not diaphoretic.  HENT:     Head: Normocephalic and atraumatic.     Jaw: No trismus.     Right Ear: External ear normal.     Left Ear: External ear normal.     Nose: Nose normal.  Eyes:     General: No scleral icterus.    Conjunctiva/sclera: Conjunctivae normal.  Neck:      Musculoskeletal: Normal range of motion.     Trachea: Phonation normal.  Cardiovascular:     Rate and Rhythm: Normal rate and regular rhythm.  Pulmonary:     Effort: Pulmonary effort is normal. No respiratory distress.     Breath sounds: No stridor.  Abdominal:     General: There is no distension.     Tenderness: There is generalized abdominal tenderness (mild discomfort). There is no guarding or rebound. Negative signs include Murphy's sign and McBurney's sign.     Hernia: No hernia is present.  Musculoskeletal: Normal range of motion.  Neurological:     Mental Status: He is alert and oriented to person, place, and time.  Psychiatric:        Behavior: Behavior normal.     ED Results and Treatments Labs (all labs ordered are listed, but only abnormal results are displayed) Labs Reviewed  COMPREHENSIVE METABOLIC PANEL - Abnormal; Notable for the following components:      Result Value   CO2 21 (*)    Glucose, Bld 156 (*)    Creatinine, Ser 1.53 (*)    ALT 49 (*)    Total Bilirubin 1.4 (*)    GFR calc non Af Amer 58 (*)    All other components within normal limits  CBC - Abnormal; Notable for the following components:   WBC 11.2 (*)    All other components within normal limits  URINALYSIS, ROUTINE W REFLEX MICROSCOPIC - Abnormal; Notable for the following components:   Color, Urine AMBER (*)    APPearance HAZY (*)    Protein, ur 30 (*)    All other components within normal limits  LIPASE, BLOOD  INFLUENZA PANEL BY PCR (TYPE A & B)  POC SARS CORONAVIRUS 2 AG -  ED  EKG  EKG Interpretation  Date/Time:    Ventricular Rate:    PR Interval:    QRS Duration:   QT Interval:    QTC Calculation:   R Axis:     Text Interpretation:        Radiology No results found.  Pertinent labs & imaging results that were available during my care of the patient  were reviewed by me and considered in my medical decision making (see chart for details).  Medications Ordered in ED Medications  sodium chloride 0.9 % bolus 1,000 mL (0 mLs Intravenous Stopped 03/16/19 0258)    Followed by  sodium chloride 0.9 % bolus 1,000 mL (0 mLs Intravenous Stopped 03/16/19 0258)    Followed by  0.9 %  sodium chloride infusion (1,000 mLs Intravenous New Bag/Given 03/16/19 0317)  sodium chloride flush (NS) 0.9 % injection 3 mL (3 mLs Intravenous Given 03/16/19 0057)  metoCLOPramide (REGLAN) injection 10 mg (10 mg Intravenous Given 03/16/19 0118)  alum & mag hydroxide-simeth (MAALOX/MYLANTA) 200-200-20 MG/5ML suspension 30 mL (30 mLs Oral Given 03/16/19 0118)  acetaminophen (TYLENOL) tablet 1,000 mg (1,000 mg Oral Given 03/16/19 0156)                                                                                                                                    Procedures Procedures  (including critical care time)  Medical Decision Making / ED Course I have reviewed the nursing notes for this encounter and the patient's prior records (if available in EHR or on provided paperwork).   Eduardo Leon was evaluated in Emergency Department on 03/16/2019 for the symptoms described in the history of present illness. He was evaluated in the context of the global COVID-19 pandemic, which necessitated consideration that the patient might be at risk for infection with the SARS-CoV-2 virus that causes COVID-19. Institutional protocols and algorithms that pertain to the evaluation of patients at risk for COVID-19 are in a state of rapid change based on information released by regulatory bodies including the CDC and federal and state organizations. These policies and algorithms were followed during the patient's care in the ED.  33 y.o. male presents with vomiting, diarrhea for 3 days. Possible suspicious food intake. . Decreased oral tolerance. Rest of history as above.  Patient  appears well, not in distress, and with no signs of toxicity. He does, however, have soft BPs.  Abdomen with mild discomfort without peritonitis. Rest of the exam as above  Labs w/ mild leukocytosis (improved from earlier), AKI ( likely from dehydration). No evidence of biliary obstruction or pancreatitis.   Most consistent with viral gastroenteritis vs food poisoning.   Doubt appendicitis, diverticulitis, severe colitis, dysentery.    Provided with IVF, reglan, tylenol, and GI cocktail.  Able to tolerate oral intake in the ED. BP improved. abd discomfort resolved.  Discussed symptomatic treatment with the patient and they will follow closely with their PCP.  Final Clinical Impression(s) / ED Diagnoses Final diagnoses:  Nausea vomiting and diarrhea  AKI (acute kidney injury) (HCC)  Dehydration    The patient appears reasonably screened and/or stabilized for discharge and I doubt any other medical condition or other [facility] requiring further screening, evaluation, or treatment in the ED at this time prior to discharge.  Disposition: Discharge  Condition: Good  I have discussed the results, Dx and Tx plan with the patient who expressed understanding and agree(s) with the plan. Discharge instructions discussed at great length. The patient was given strict return precautions who verbalized understanding of the instructions. No further questions at time of discharge.    ED Discharge Orders         Ordered    ondansetron (ZOFRAN ODT) 4 MG disintegrating tablet  Every 8 hours PRN     03/16/19 2683           Follow Up: Primary care provider  Schedule an appointment as soon as possible for a visit  in 3-5 days, If symptoms do not improve or  worsen      This chart was dictated using voice recognition software.  Despite best efforts to proofread,  errors can occur which can change the documentation meaning.   Nira Conn, MD 03/16/19 [phone]

## 2019-03-16 NOTE — ED Notes (Signed)
Discharge instructions discussed with pt. Pt verbalized understanding. Pt stable and ambulatory. No signature pad available. 

## 2019-03-21 ENCOUNTER — Ambulatory Visit (INDEPENDENT_AMBULATORY_CARE_PROVIDER_SITE_OTHER): Payer: Managed Care, Other (non HMO) | Admitting: Internal Medicine

## 2019-03-21 ENCOUNTER — Other Ambulatory Visit: Payer: Self-pay

## 2019-03-21 ENCOUNTER — Other Ambulatory Visit (HOSPITAL_COMMUNITY)
Admission: RE | Admit: 2019-03-21 | Discharge: 2019-03-21 | Disposition: A | Discharge status: Home / Self Care | Payer: Managed Care, Other (non HMO) | Source: Ambulatory Visit | Attending: Internal Medicine | Admitting: Internal Medicine

## 2019-03-21 ENCOUNTER — Encounter: Payer: Self-pay | Admitting: Internal Medicine

## 2019-03-21 VITALS — BP 122/82 | HR 70 | Temp 98.4°F | Ht 66.0 in | Wt 192.8 lb

## 2019-03-21 DIAGNOSIS — B20 Human immunodeficiency virus [HIV] disease: Secondary | ICD-10-CM | POA: Diagnosis not present

## 2019-03-21 DIAGNOSIS — Z113 Encounter for screening for infections with a predominantly sexual mode of transmission: Secondary | ICD-10-CM

## 2019-03-21 DIAGNOSIS — Z79899 Other long term (current) drug therapy: Secondary | ICD-10-CM

## 2019-03-21 DIAGNOSIS — Z23 Encounter for immunization: Secondary | ICD-10-CM | POA: Diagnosis not present

## 2019-03-21 MED ORDER — BIKTARVY 50-200-25 MG PO TABS: 1.0000 | ORAL_TABLET | Freq: Every day | ORAL | 11 refills | Status: DC

## 2019-03-21 NOTE — Addendum Note (Signed)
Addended by: Landis Gandy on: 03/21/2019 10:56 AM   Modules accepted: Orders

## 2019-03-21 NOTE — Progress Notes (Signed)
   Subjective:    Patient ID: Eduardo Leon, male    DOB: 1985-08-10, 33 y.o.   MRN: [id number]  HPI Here for follow up of HIV Doing well with no new issues.  No other complaints.  No missed doses.  No associated n/v/d.  No rash.  No penile discharge. Uses condoms with sexual activity.   CD4 of 782, viral load < 20.  Recently in the ED for gastroenteritis and improving.    Review of Systems  Constitutional: Negative for fatigue.  Gastrointestinal: Negative for diarrhea and nausea.  Genitourinary: Negative for genital sores.  Skin: Negative for rash.       Objective:   Physical Exam Vitals signs reviewed.  Constitutional:      Appearance: Normal appearance.  Eyes:     General: No scleral icterus. Cardiovascular:     Rate and Rhythm: Normal rate and regular rhythm.     Heart sounds: No murmur.  Pulmonary:     Effort: Pulmonary effort is normal.  Skin:    Findings: No rash.  Neurological:     Mental Status: He is alert.  Psychiatric:        Mood and Affect: Mood normal.   SH: no tobacco       Assessment & Plan:

## 2019-03-21 NOTE — Assessment & Plan Note (Signed)
Lipid panel noted and no concerns.  

## 2019-03-21 NOTE — Assessment & Plan Note (Signed)
Flu shot, Menveo #1 and HPV #2.  Will come back in about 4 months for nurse visit for Menveo #2 and HPV #3.

## 2019-03-21 NOTE — Assessment & Plan Note (Signed)
Screened negative, swabs today

## 2019-03-21 NOTE — Assessment & Plan Note (Signed)
He is doing well with no issues.  No changes and refill provided for the year.

## 2019-03-22 LAB — CYTOLOGY, (ORAL, ANAL, URETHRAL) ANCILLARY ONLY
Chlamydia: NEGATIVE
Chlamydia: NEGATIVE
Comment: NEGATIVE
Comment: NEGATIVE
Comment: NORMAL
Comment: NORMAL
Neisseria Gonorrhea: NEGATIVE
Neisseria Gonorrhea: NEGATIVE

## 2019-06-12 ENCOUNTER — Emergency Department (HOSPITAL_COMMUNITY)
Admission: EM | Admit: 2019-06-12 | Discharge: 2019-06-12 | Disposition: A | Discharge status: Home / Self Care | Payer: Managed Care, Other (non HMO) | Attending: Emergency Medicine | Admitting: Emergency Medicine

## 2019-06-12 ENCOUNTER — Other Ambulatory Visit: Payer: Self-pay

## 2019-06-12 ENCOUNTER — Emergency Department (HOSPITAL_COMMUNITY): Payer: Managed Care, Other (non HMO)

## 2019-06-12 ENCOUNTER — Encounter (HOSPITAL_COMMUNITY): Payer: Self-pay | Admitting: Emergency Medicine

## 2019-06-12 DIAGNOSIS — Y92198 Other place in other specified residential institution as the place of occurrence of the external cause: Secondary | ICD-10-CM | POA: Diagnosis not present

## 2019-06-12 DIAGNOSIS — B20 Human immunodeficiency virus [HIV] disease: Secondary | ICD-10-CM | POA: Insufficient documentation

## 2019-06-12 DIAGNOSIS — Y939 Activity, unspecified: Secondary | ICD-10-CM | POA: Insufficient documentation

## 2019-06-12 DIAGNOSIS — Z79899 Other long term (current) drug therapy: Secondary | ICD-10-CM | POA: Diagnosis not present

## 2019-06-12 DIAGNOSIS — S6991XA Unspecified injury of right wrist, hand and finger(s), initial encounter: Secondary | ICD-10-CM

## 2019-06-12 DIAGNOSIS — S61256A Open bite of right little finger without damage to nail, initial encounter: Secondary | ICD-10-CM | POA: Diagnosis not present

## 2019-06-12 DIAGNOSIS — Z23 Encounter for immunization: Secondary | ICD-10-CM | POA: Diagnosis not present

## 2019-06-12 DIAGNOSIS — Y990 Civilian activity done for income or pay: Secondary | ICD-10-CM | POA: Diagnosis not present

## 2019-06-12 DIAGNOSIS — W503XXA Accidental bite by another person, initial encounter: Secondary | ICD-10-CM

## 2019-06-12 DIAGNOSIS — S41051A Open bite of right shoulder, initial encounter: Secondary | ICD-10-CM | POA: Diagnosis present

## 2019-06-12 MED ORDER — AMOXICILLIN-POT CLAVULANATE 875-125 MG PO TABS: 1.0000 | ORAL_TABLET | Freq: Two times a day (BID) | ORAL | 0 refills | Status: AC

## 2019-06-12 MED ORDER — AMOXICILLIN-POT CLAVULANATE 875-125 MG PO TABS
1.0000 | ORAL_TABLET | Freq: Once | ORAL | Status: AC
  Administered 2019-06-12: 1 via ORAL
  Filled 2019-06-12: qty 1

## 2019-06-12 MED ORDER — TETANUS-DIPHTH-ACELL PERTUSSIS 5-2.5-18.5 LF-MCG/0.5 IM SUSP
0.5000 mL | Freq: Once | INTRAMUSCULAR | Status: AC
  Administered 2019-06-12: 0.5 mL via INTRAMUSCULAR
  Filled 2019-06-12: qty 0.5

## 2019-06-12 MED ORDER — ACETAMINOPHEN 325 MG PO TABS
650.0000 mg | ORAL_TABLET | Freq: Once | ORAL | Status: AC
  Administered 2019-06-12: 650 mg via ORAL
  Filled 2019-06-12: qty 2

## 2019-06-12 NOTE — ED Notes (Signed)
Patient transported to X-ray 

## 2019-06-12 NOTE — ED Notes (Signed)
Cleaned and dressed pts finger and shoulder.

## 2019-06-12 NOTE — ED Notes (Signed)
PT to X-ray
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
## 2019-07-19 ENCOUNTER — Ambulatory Visit (INDEPENDENT_AMBULATORY_CARE_PROVIDER_SITE_OTHER): Payer: Self-pay

## 2019-07-19 ENCOUNTER — Other Ambulatory Visit: Payer: Self-pay

## 2019-07-19 DIAGNOSIS — Z23 Encounter for immunization: Secondary | ICD-10-CM

## 2020-01-31 ENCOUNTER — Ambulatory Visit (HOSPITAL_COMMUNITY)
Admission: EM | Admit: 2020-01-31 | Discharge: 2020-01-31 | Disposition: A | Discharge status: Home / Self Care | Payer: Managed Care, Other (non HMO) | Attending: Family Medicine | Admitting: Family Medicine

## 2020-01-31 ENCOUNTER — Other Ambulatory Visit: Payer: Self-pay

## 2020-01-31 ENCOUNTER — Encounter (HOSPITAL_COMMUNITY): Payer: Self-pay

## 2020-01-31 DIAGNOSIS — Z21 Asymptomatic human immunodeficiency virus [HIV] infection status: Secondary | ICD-10-CM | POA: Insufficient documentation

## 2020-01-31 DIAGNOSIS — Z7689 Persons encountering health services in other specified circumstances: Secondary | ICD-10-CM

## 2020-01-31 DIAGNOSIS — M797 Fibromyalgia: Secondary | ICD-10-CM | POA: Insufficient documentation

## 2020-01-31 DIAGNOSIS — Z881 Allergy status to other antibiotic agents status: Secondary | ICD-10-CM | POA: Diagnosis not present

## 2020-01-31 DIAGNOSIS — Z885 Allergy status to narcotic agent status: Secondary | ICD-10-CM | POA: Diagnosis not present

## 2020-01-31 DIAGNOSIS — Z202 Contact with and (suspected) exposure to infections with a predominantly sexual mode of transmission: Secondary | ICD-10-CM | POA: Diagnosis not present

## 2020-01-31 NOTE — Discharge Instructions (Addendum)
Check MyChart for results. 
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
## 2020-01-31 NOTE — ED Triage Notes (Signed)
Patient in requesting sti testing  Denies recent exposure, dysuria, penile itching, burning, discharge, n/v, diarrhea, fever or other sxs

## 2020-02-01 LAB — RPR
RPR Ser Ql: REACTIVE — AB
RPR Titer: 1:1 {titer}

## 2020-02-02 LAB — CYTOLOGY, (ORAL, ANAL, URETHRAL) ANCILLARY ONLY
Chlamydia: NEGATIVE
Comment: NEGATIVE
Comment: NEGATIVE
Comment: NORMAL
Neisseria Gonorrhea: NEGATIVE
Trichomonas: NEGATIVE

## 2020-02-02 LAB — T.PALLIDUM AB, TOTAL: T Pallidum Abs: REACTIVE — AB

## 2020-02-21 ENCOUNTER — Telehealth: Payer: Self-pay

## 2020-02-21 NOTE — Telephone Encounter (Signed)
Patient called requesting RPR result performed at [facility] on 01/31/20. Patient has been advised he will need to contact UC to discuss results since they were done at their office. Patient verbalized understanding. Ardelle Haliburton T Pricilla Loveless

## 2020-02-27 ENCOUNTER — Ambulatory Visit (HOSPITAL_COMMUNITY)
Admission: EM | Admit: 2020-02-27 | Discharge: 2020-02-27 | Disposition: A | Discharge status: Home / Self Care | Payer: Managed Care, Other (non HMO) | Attending: Internal Medicine | Admitting: Internal Medicine

## 2020-02-27 ENCOUNTER — Other Ambulatory Visit: Payer: Self-pay

## 2020-02-27 DIAGNOSIS — Z20822 Contact with and (suspected) exposure to covid-19: Secondary | ICD-10-CM | POA: Diagnosis not present

## 2020-02-27 DIAGNOSIS — Z01812 Encounter for preprocedural laboratory examination: Secondary | ICD-10-CM | POA: Insufficient documentation

## 2020-02-27 DIAGNOSIS — Z1152 Encounter for screening for COVID-19: Secondary | ICD-10-CM | POA: Diagnosis not present

## 2020-02-27 LAB — SARS CORONAVIRUS 2 (TAT 6-24 HRS): SARS Coronavirus 2: NEGATIVE

## 2020-02-27 NOTE — ED Triage Notes (Signed)
Pt presents for covid testing with no known symptoms. 

## 2020-02-28 ENCOUNTER — Other Ambulatory Visit: Payer: Managed Care, Other (non HMO)

## 2020-02-28 ENCOUNTER — Other Ambulatory Visit (HOSPITAL_COMMUNITY)
Admission: RE | Admit: 2020-02-28 | Discharge: 2020-02-28 | Disposition: A | Discharge status: Home / Self Care | Payer: Managed Care, Other (non HMO) | Source: Ambulatory Visit | Attending: Internal Medicine | Admitting: Internal Medicine

## 2020-02-28 DIAGNOSIS — B20 Human immunodeficiency virus [HIV] disease: Secondary | ICD-10-CM | POA: Diagnosis not present

## 2020-02-28 DIAGNOSIS — Z79899 Other long term (current) drug therapy: Secondary | ICD-10-CM

## 2020-02-28 DIAGNOSIS — Z113 Encounter for screening for infections with a predominantly sexual mode of transmission: Secondary | ICD-10-CM | POA: Diagnosis present

## 2020-02-29 LAB — URINE CYTOLOGY ANCILLARY ONLY
Chlamydia: NEGATIVE
Comment: NEGATIVE
Comment: NORMAL
Neisseria Gonorrhea: NEGATIVE

## 2020-02-29 LAB — T-HELPER CELL (CD4) - (RCID CLINIC ONLY)
CD4 % Helper T Cell: 28 % — ABNORMAL LOW (ref 33–65)
CD4 T Cell Abs: 828 /uL (ref 400–1790)

## 2020-03-05 LAB — CBC WITH DIFFERENTIAL/PLATELET
Absolute Monocytes: 700 cells/uL (ref 200–950)
Basophils Absolute: 62 cells/uL (ref 0–200)
Basophils Relative: 1.1 %
Eosinophils Absolute: 39 cells/uL (ref 15–500)
Eosinophils Relative: 0.7 %
HCT: 46.1 % (ref 38.5–50.0)
Hemoglobin: 16.1 g/dL (ref 13.2–17.1)
Lymphs Abs: 3153 cells/uL (ref 850–3900)
MCH: 31.1 pg (ref 27.0–33.0)
MCHC: 34.9 g/dL (ref 32.0–36.0)
MCV: 89 fL (ref 80.0–100.0)
MPV: 9.2 fL (ref 7.5–12.5)
Monocytes Relative: 12.5 %
Neutro Abs: 1646 cells/uL (ref 1500–7800)
Neutrophils Relative %: 29.4 %
Platelets: 333 10*3/uL (ref 140–400)
RBC: 5.18 10*6/uL (ref 4.20–5.80)
RDW: 12.4 % (ref 11.0–15.0)
Total Lymphocyte: 56.3 %
WBC: 5.6 10*3/uL (ref 3.8–10.8)

## 2020-03-05 LAB — COMPLETE METABOLIC PANEL WITH GFR
AG Ratio: 1.3 (calc) (ref 1.0–2.5)
ALT: 44 U/L (ref 9–46)
AST: 20 U/L (ref 10–40)
Albumin: 4.2 g/dL (ref 3.6–5.1)
Alkaline phosphatase (APISO): 95 U/L (ref 36–130)
BUN: 9 mg/dL (ref 7–25)
CO2: 28 mmol/L (ref 20–32)
Calcium: 9.5 mg/dL (ref 8.6–10.3)
Chloride: 103 mmol/L (ref 98–110)
Creat: 1.17 mg/dL (ref 0.60–1.35)
GFR, Est African American: 94 mL/min/{1.73_m2} (ref >=60)
GFR, Est Non African American: 81 mL/min/{1.73_m2} (ref >=60)
Globulin: 3.3 g/dL (calc) (ref 1.9–3.7)
Glucose, Bld: 158 mg/dL — ABNORMAL HIGH (ref 65–99)
Potassium: 4.4 mmol/L (ref 3.5–5.3)
Sodium: 137 mmol/L (ref 135–146)
Total Bilirubin: 0.4 mg/dL (ref 0.2–1.2)
Total Protein: 7.5 g/dL (ref 6.1–8.1)

## 2020-03-05 LAB — LIPID PANEL
Cholesterol: 211 mg/dL — ABNORMAL HIGH (ref <200)
HDL: 35 mg/dL — ABNORMAL LOW (ref >=40)
LDL Cholesterol (Calc): 136 mg/dL (calc) — ABNORMAL HIGH
Non-HDL Cholesterol (Calc): 176 mg/dL (calc) — ABNORMAL HIGH (ref <130)
Total CHOL/HDL Ratio: 6 (calc) — ABNORMAL HIGH (ref <5.0)
Triglycerides: 246 mg/dL — ABNORMAL HIGH (ref <150)

## 2020-03-05 LAB — RPR TITER: RPR Titer: 1:1 {titer} — ABNORMAL HIGH

## 2020-03-05 LAB — RPR: RPR Ser Ql: REACTIVE — AB

## 2020-03-05 LAB — HIV-1 RNA QUANT-NO REFLEX-BLD
HIV 1 RNA Quant: <20 Copies/mL
HIV-1 RNA Quant, Log: <1.3 Log cps/mL

## 2020-03-05 LAB — FLUORESCENT TREPONEMAL AB(FTA)-IGG-BLD: Fluorescent Treponemal ABS: REACTIVE — AB

## 2020-03-14 ENCOUNTER — Other Ambulatory Visit (HOSPITAL_COMMUNITY)
Admission: RE | Admit: 2020-03-14 | Discharge: 2020-03-14 | Disposition: A | Discharge status: Home / Self Care | Payer: Managed Care, Other (non HMO) | Source: Ambulatory Visit | Attending: Internal Medicine | Admitting: Internal Medicine

## 2020-03-14 ENCOUNTER — Ambulatory Visit (INDEPENDENT_AMBULATORY_CARE_PROVIDER_SITE_OTHER): Payer: Managed Care, Other (non HMO) | Admitting: Internal Medicine

## 2020-03-14 ENCOUNTER — Encounter: Payer: Self-pay | Admitting: Internal Medicine

## 2020-03-14 ENCOUNTER — Other Ambulatory Visit: Payer: Self-pay

## 2020-03-14 VITALS — BP 144/85 | HR 72 | Temp 98.0°F | Wt 203.0 lb

## 2020-03-14 DIAGNOSIS — B20 Human immunodeficiency virus [HIV] disease: Secondary | ICD-10-CM

## 2020-03-14 DIAGNOSIS — Z79899 Other long term (current) drug therapy: Secondary | ICD-10-CM

## 2020-03-14 DIAGNOSIS — Z113 Encounter for screening for infections with a predominantly sexual mode of transmission: Secondary | ICD-10-CM | POA: Insufficient documentation

## 2020-03-14 DIAGNOSIS — R739 Hyperglycemia, unspecified: Secondary | ICD-10-CM | POA: Insufficient documentation

## 2020-03-14 DIAGNOSIS — Z23 Encounter for immunization: Secondary | ICD-10-CM

## 2020-03-14 NOTE — Assessment & Plan Note (Signed)
Lipid panel noted 

## 2020-03-14 NOTE — Progress Notes (Signed)
   Subjective:    Patient ID: Eduardo Leon, male    DOB: 21-Nov-1985, 34 y.o.   MRN: [id number]  HPI Here for follow up of HIV. He continues on [place] with no missed doses.  No new issues.  CD4 of 828, viral load < 20.  Creat, liver wnl.  Glucose a bit more elevated.   Review of Systems  Constitutional: Negative for fatigue.  Gastrointestinal: Negative for diarrhea and nausea.  Skin: Negative for rash.       Objective:   Physical Exam Vitals reviewed.  Constitutional:      Appearance: Normal appearance.  Eyes:     General: No scleral icterus. Cardiovascular:     Rate and Rhythm: Normal rate and regular rhythm.     Heart sounds: No murmur heard.   Pulmonary:     Effort: Pulmonary effort is normal.  Skin:    Findings: No rash.  Neurological:     Mental Status: He is alert.  Psychiatric:        Mood and Affect: Mood normal.   SH: no tobacco       Assessment & Plan:

## 2020-03-14 NOTE — Assessment & Plan Note (Signed)
Will screen today 

## 2020-03-14 NOTE — Assessment & Plan Note (Signed)
Elevated today more than previous.  I am concerned he is developing DM.  He has a strong family history.  I have advised he get a PCP and recommendations given.

## 2020-03-14 NOTE — Assessment & Plan Note (Signed)
He continues to do well with no issues.  No changes and rtc 1 year

## 2020-03-14 NOTE — Assessment & Plan Note (Signed)
Discussed the flu shot and given today 

## 2020-03-16 LAB — CYTOLOGY, (ORAL, ANAL, URETHRAL) ANCILLARY ONLY
Chlamydia: NEGATIVE
Chlamydia: NEGATIVE
Comment: NEGATIVE
Comment: NEGATIVE
Comment: NORMAL
Comment: NORMAL
Neisseria Gonorrhea: NEGATIVE
Neisseria Gonorrhea: NEGATIVE

## 2020-05-24 ENCOUNTER — Other Ambulatory Visit: Payer: Self-pay | Admitting: Internal Medicine

## 2020-05-24 DIAGNOSIS — B20 Human immunodeficiency virus [HIV] disease: Secondary | ICD-10-CM

## 2020-06-05 ENCOUNTER — Ambulatory Visit (HOSPITAL_COMMUNITY)
Admission: EM | Admit: 2020-06-05 | Discharge: 2020-06-05 | Disposition: A | Discharge status: Home / Self Care | Payer: Managed Care, Other (non HMO) | Attending: Urgent Care | Admitting: Urgent Care

## 2020-06-05 ENCOUNTER — Other Ambulatory Visit: Payer: Self-pay

## 2020-06-05 ENCOUNTER — Encounter (HOSPITAL_COMMUNITY): Payer: Self-pay

## 2020-06-05 DIAGNOSIS — Z202 Contact with and (suspected) exposure to infections with a predominantly sexual mode of transmission: Secondary | ICD-10-CM | POA: Diagnosis not present

## 2020-06-05 DIAGNOSIS — Z8619 Personal history of other infectious and parasitic diseases: Secondary | ICD-10-CM | POA: Diagnosis present

## 2020-06-05 NOTE — ED Triage Notes (Signed)
Pt presents for STD treatment; pt believes he may have had syphilis exposure, pt is not displaying any symptoms but has Hx of of syphilis & HIV .
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
## 2020-06-06 LAB — RPR: RPR Ser Ql: NONREACTIVE

## 2020-06-06 LAB — CYTOLOGY, (ORAL, ANAL, URETHRAL) ANCILLARY ONLY
Chlamydia: POSITIVE — AB
Comment: NEGATIVE
Comment: NEGATIVE
Comment: NORMAL
Neisseria Gonorrhea: NEGATIVE
Trichomonas: NEGATIVE

## 2020-06-07 ENCOUNTER — Telehealth (HOSPITAL_COMMUNITY): Payer: Self-pay | Admitting: Emergency Medicine

## 2020-06-07 MED ORDER — DOXYCYCLINE HYCLATE 100 MG PO CAPS
100.0000 mg | ORAL_CAPSULE | Freq: Two times a day (BID) | ORAL | 0 refills | Status: AC
Start: 2020-06-07 — End: 2020-06-14
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
## 2021-02-18 ENCOUNTER — Ambulatory Visit (HOSPITAL_COMMUNITY)
Admission: EM | Admit: 2021-02-18 | Discharge: 2021-02-18 | Disposition: A | Discharge status: Home / Self Care | Payer: Managed Care, Other (non HMO) | Attending: Student | Admitting: Student

## 2021-02-18 ENCOUNTER — Other Ambulatory Visit: Payer: Self-pay

## 2021-02-18 ENCOUNTER — Encounter (HOSPITAL_COMMUNITY): Payer: Self-pay

## 2021-02-18 DIAGNOSIS — Z113 Encounter for screening for infections with a predominantly sexual mode of transmission: Secondary | ICD-10-CM | POA: Insufficient documentation

## 2021-02-18 DIAGNOSIS — B20 Human immunodeficiency virus [HIV] disease: Secondary | ICD-10-CM | POA: Insufficient documentation

## 2021-02-18 DIAGNOSIS — Z91013 Allergy to seafood: Secondary | ICD-10-CM | POA: Insufficient documentation

## 2021-02-18 DIAGNOSIS — Z760 Encounter for issue of repeat prescription: Secondary | ICD-10-CM | POA: Insufficient documentation

## 2021-02-18 MED ORDER — EPINEPHRINE 0.3 MG/0.3ML IJ SOAJ: 0.3000 mg | INTRAMUSCULAR | 0 refills | Status: AC | PRN

## 2021-02-18 MED ORDER — IBUPROFEN 800 MG PO TABS: 800.0000 mg | ORAL_TABLET | Freq: Three times a day (TID) | ORAL | 0 refills | Status: AC | PRN
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
## 2021-02-18 NOTE — ED Triage Notes (Signed)
Pt presents for STD testing with no known symptoms.  

## 2021-02-18 NOTE — Discharge Instructions (Addendum)
-  We'll call in 2-3 days with any positive test results

## 2021-02-19 LAB — CYTOLOGY, (ORAL, ANAL, URETHRAL) ANCILLARY ONLY
Chlamydia: NEGATIVE
Comment: NEGATIVE
Comment: NEGATIVE
Comment: NORMAL
Neisseria Gonorrhea: NEGATIVE
Trichomonas: NEGATIVE

## 2021-03-06 ENCOUNTER — Ambulatory Visit: Payer: Managed Care, Other (non HMO) | Admitting: Internal Medicine

## 2021-03-18 ENCOUNTER — Encounter: Payer: Self-pay | Admitting: Internal Medicine

## 2021-03-18 ENCOUNTER — Other Ambulatory Visit: Payer: Self-pay

## 2021-03-18 ENCOUNTER — Ambulatory Visit: Payer: Self-pay

## 2021-03-18 ENCOUNTER — Ambulatory Visit (INDEPENDENT_AMBULATORY_CARE_PROVIDER_SITE_OTHER): Payer: Self-pay | Admitting: Internal Medicine

## 2021-03-18 VITALS — BP 129/84 | HR 81 | Temp 99.1°F | Wt 197.0 lb

## 2021-03-18 DIAGNOSIS — Z79899 Other long term (current) drug therapy: Secondary | ICD-10-CM

## 2021-03-18 DIAGNOSIS — Z113 Encounter for screening for infections with a predominantly sexual mode of transmission: Secondary | ICD-10-CM

## 2021-03-18 DIAGNOSIS — Z23 Encounter for immunization: Secondary | ICD-10-CM

## 2021-03-18 DIAGNOSIS — B20 Human immunodeficiency virus [HIV] disease: Secondary | ICD-10-CM

## 2021-03-18 NOTE — Assessment & Plan Note (Signed)
Will check his lipid panel 

## 2021-03-18 NOTE — Progress Notes (Signed)
   Subjective:    Patient ID: Eduardo Leon, male    DOB: Jul 13, 1985, 35 y.o.   MRN: [id number]  HPI Here for yearly follow up of HIV He continues on Biktarvy with no missed doses.  Feels well and no complaints.  No issues with getting, taking or tolerating the medication.  Needs labs today.  Asking about Cabenuva.  Now without insurance and transitioning to [employer] until he gets a new job.     Review of Systems  Constitutional:  Negative for fatigue.  Gastrointestinal:  Negative for diarrhea and nausea.  Skin:  Negative for rash.      Objective:   Physical Exam Eyes:     General: No scleral icterus. Pulmonary:     Effort: Pulmonary effort is normal.  Skin:    Findings: No rash.  Neurological:     General: No focal deficit present.     Mental Status: He is alert.  Psychiatric:        Mood and Affect: Mood normal.   SH: no tobacco       Assessment & Plan:

## 2021-03-18 NOTE — Assessment & Plan Note (Signed)
Doing well with no issues.  Labs today and he will continue with Biktarvy. I discussed Cabenuva and I recommended waiting until he has insurance again in case it is not covered, rather than start now and find out when he has insurance that it won't be covered.  He though will call if he is interested.  Otherwise will follow up in 1 year.

## 2021-03-18 NOTE — Assessment & Plan Note (Signed)
Will screen today and adivsed to call for a work in visit if he needs testing during the year otherwise

## 2021-03-18 NOTE — Assessment & Plan Note (Signed)
Discussed the flu shot and he will think about it Not given today

## 2021-03-19 LAB — T-HELPER CELL (CD4) - (RCID CLINIC ONLY)
CD4 % Helper T Cell: 30 % — ABNORMAL LOW (ref 33–65)
CD4 T Cell Abs: 691 /uL (ref 400–1790)

## 2021-03-20 ENCOUNTER — Telehealth: Payer: Self-pay

## 2021-03-20 LAB — CBC WITH DIFFERENTIAL/PLATELET
Absolute Monocytes: 668 cells/uL (ref 200–950)
Basophils Absolute: 101 cells/uL (ref 0–200)
Basophils Relative: 1.6 %
Eosinophils Absolute: 19 cells/uL (ref 15–500)
Eosinophils Relative: 0.3 %
HCT: 47.8 % (ref 38.5–50.0)
Hemoglobin: 16.5 g/dL (ref 13.2–17.1)
Lymphs Abs: 2860 cells/uL (ref 850–3900)
MCH: 30.7 pg (ref 27.0–33.0)
MCHC: 34.5 g/dL (ref 32.0–36.0)
MCV: 89 fL (ref 80.0–100.0)
MPV: 9.1 fL (ref 7.5–12.5)
Monocytes Relative: 10.6 %
Neutro Abs: 2652 cells/uL (ref 1500–7800)
Neutrophils Relative %: 42.1 %
Platelets: 355 10*3/uL (ref 140–400)
RBC: 5.37 10*6/uL (ref 4.20–5.80)
RDW: 12.4 % (ref 11.0–15.0)
Total Lymphocyte: 45.4 %
WBC: 6.3 10*3/uL (ref 3.8–10.8)

## 2021-03-20 LAB — URINE CYTOLOGY ANCILLARY ONLY
Chlamydia: NEGATIVE
Comment: NEGATIVE
Comment: NORMAL
Neisseria Gonorrhea: NEGATIVE

## 2021-03-20 LAB — COMPLETE METABOLIC PANEL WITH GFR
AG Ratio: 1.3 (calc) (ref 1.0–2.5)
ALT: 36 U/L (ref 9–46)
AST: 20 U/L (ref 10–40)
Albumin: 4.2 g/dL (ref 3.6–5.1)
Alkaline phosphatase (APISO): 89 U/L (ref 36–130)
BUN: 14 mg/dL (ref 7–25)
CO2: 30 mmol/L (ref 20–32)
Calcium: 9.5 mg/dL (ref 8.6–10.3)
Chloride: 106 mmol/L (ref 98–110)
Creat: 1.2 mg/dL (ref 0.60–1.26)
Globulin: 3.2 g/dL (calc) (ref 1.9–3.7)
Glucose, Bld: 144 mg/dL — ABNORMAL HIGH (ref 65–99)
Potassium: 4 mmol/L (ref 3.5–5.3)
Sodium: 141 mmol/L (ref 135–146)
Total Bilirubin: 0.3 mg/dL (ref 0.2–1.2)
Total Protein: 7.4 g/dL (ref 6.1–8.1)
eGFR: 80 mL/min/{1.73_m2} (ref >=60)

## 2021-03-20 LAB — LIPID PANEL
Cholesterol: 215 mg/dL — ABNORMAL HIGH (ref <200)
HDL: 38 mg/dL — ABNORMAL LOW (ref >=40)
LDL Cholesterol (Calc): 145 mg/dL (calc) — ABNORMAL HIGH
Non-HDL Cholesterol (Calc): 177 mg/dL (calc) — ABNORMAL HIGH (ref <130)
Total CHOL/HDL Ratio: 5.7 (calc) — ABNORMAL HIGH (ref <5.0)
Triglycerides: 181 mg/dL — ABNORMAL HIGH (ref <150)

## 2021-03-20 LAB — HIV-1 RNA QUANT-NO REFLEX-BLD
HIV 1 RNA Quant: NOT DETECTED Copies/mL
HIV-1 RNA Quant, Log: NOT DETECTED Log cps/mL

## 2021-03-20 LAB — CYTOLOGY, (ORAL, ANAL, URETHRAL) ANCILLARY ONLY
Chlamydia: NEGATIVE
Chlamydia: NEGATIVE
Comment: NEGATIVE
Comment: NEGATIVE
Comment: NORMAL
Comment: NORMAL
Neisseria Gonorrhea: NEGATIVE
Neisseria Gonorrhea: POSITIVE — AB

## 2021-03-20 LAB — RPR TITER: RPR Titer: 1:1 {titer} — ABNORMAL HIGH

## 2021-03-20 LAB — RPR: RPR Ser Ql: REACTIVE — AB

## 2021-03-20 LAB — FLUORESCENT TREPONEMAL AB(FTA)-IGG-BLD: Fluorescent Treponemal ABS: REACTIVE — AB

## 2021-03-20 NOTE — Telephone Encounter (Signed)
-----   Message from Gardiner Barefoot, MD sent at 03/20/2021  3:03 PM EST ----- Please let him know he is positive for gonorrhea and should come back in a visit for treatment asap.  thanks

## 2021-03-20 NOTE — Telephone Encounter (Signed)
Spoke with patient, relayed positive gonorrhea results. He accepts appointment for treatment with Dr. Luciana Axe this Friday 12/2. Advised to refrain from sex in the meantime. Patient verbalized understanding and has no further questions.   Sandie Ano, RN

## 2021-03-22 ENCOUNTER — Ambulatory Visit (INDEPENDENT_AMBULATORY_CARE_PROVIDER_SITE_OTHER): Payer: Self-pay | Admitting: Internal Medicine

## 2021-03-22 ENCOUNTER — Encounter: Payer: Self-pay | Admitting: Internal Medicine

## 2021-03-22 ENCOUNTER — Other Ambulatory Visit: Payer: Self-pay

## 2021-03-22 VITALS — BP 130/78 | HR 64 | Temp 98.3°F | Wt 198.0 lb

## 2021-03-22 DIAGNOSIS — A549 Gonococcal infection, unspecified: Secondary | ICD-10-CM | POA: Insufficient documentation

## 2021-03-22 DIAGNOSIS — Z113 Encounter for screening for infections with a predominantly sexual mode of transmission: Secondary | ICD-10-CM

## 2021-03-22 MED ORDER — AZITHROMYCIN 250 MG PO TABS
500.0000 mg | ORAL_TABLET | Freq: Once | ORAL | Status: AC
  Administered 2021-03-22: 500 mg via ORAL

## 2021-03-22 MED ORDER — CEFTRIAXONE SODIUM 500 MG IJ SOLR
500.0000 mg | Freq: Once | INTRAMUSCULAR | Status: AC
  Administered 2021-03-22: 500 mg via INTRAMUSCULAR

## 2021-03-22 NOTE — Progress Notes (Signed)
   Subjective:    Patient ID: Eduardo Leon, male    DOB: 01/01/1986, 35 y.o.   MRN: [id number]  HPI Here for a work in visit for a positive throat swab for gonorrhea. He was seen by me earlier this week for routine care and now with a positive culture.  No symptoms.  All other tests negative.     Review of Systems  HENT:  Negative for sore throat.   Gastrointestinal:  Negative for diarrhea.  Genitourinary:  Negative for penile discharge.      Objective:   Physical Exam HENT:     Mouth/Throat:     Pharynx: No oropharyngeal exudate.  Eyes:     General: No scleral icterus. Pulmonary:     Effort: Pulmonary effort is normal.  Neurological:     Mental Status: He is alert.          Assessment & Plan:

## 2021-03-22 NOTE — Assessment & Plan Note (Signed)
Discussed results and treatment options.  He was given ceftriaxone and empiric azithromycin.  He otherwise will follow up as scheduled. 30 minutes spent including discussion of the findings and treatment given, monitoring.

## 2021-05-07 ENCOUNTER — Other Ambulatory Visit: Payer: Self-pay | Admitting: Internal Medicine

## 2021-05-07 DIAGNOSIS — B20 Human immunodeficiency virus [HIV] disease: Secondary | ICD-10-CM

## 2022-01-28 ENCOUNTER — Other Ambulatory Visit: Payer: Self-pay | Admitting: Internal Medicine

## 2022-01-28 DIAGNOSIS — B20 Human immunodeficiency virus [HIV] disease: Secondary | ICD-10-CM

## 2022-03-14 ENCOUNTER — Other Ambulatory Visit: Payer: Self-pay | Admitting: Internal Medicine

## 2022-03-14 DIAGNOSIS — B20 Human immunodeficiency virus [HIV] disease: Secondary | ICD-10-CM

## 2022-03-17 NOTE — Telephone Encounter (Signed)
Pending. Upcoming appt on 11/28

## 2022-03-18 ENCOUNTER — Encounter: Payer: Self-pay | Admitting: Internal Medicine

## 2022-03-18 ENCOUNTER — Other Ambulatory Visit: Payer: Self-pay

## 2022-03-18 ENCOUNTER — Ambulatory Visit (INDEPENDENT_AMBULATORY_CARE_PROVIDER_SITE_OTHER): Payer: Self-pay | Admitting: Internal Medicine

## 2022-03-18 ENCOUNTER — Telehealth: Payer: Self-pay | Admitting: Pharmacist

## 2022-03-18 VITALS — BP 119/82 | HR 67 | Temp 97.7°F | Ht 67.0 in | Wt 198.0 lb

## 2022-03-18 DIAGNOSIS — Z79899 Other long term (current) drug therapy: Secondary | ICD-10-CM

## 2022-03-18 DIAGNOSIS — B20 Human immunodeficiency virus [HIV] disease: Secondary | ICD-10-CM

## 2022-03-18 DIAGNOSIS — Z5181 Encounter for therapeutic drug level monitoring: Secondary | ICD-10-CM

## 2022-03-18 DIAGNOSIS — Z113 Encounter for screening for infections with a predominantly sexual mode of transmission: Secondary | ICD-10-CM

## 2022-03-18 DIAGNOSIS — Z23 Encounter for immunization: Secondary | ICD-10-CM

## 2022-03-18 MED ORDER — CABOTEGRAVIR & RILPIVIRINE ER 600 & 900 MG/3ML IM SUER: 1.0000 | INTRAMUSCULAR | 5 refills | Status: DC

## 2022-03-18 MED ORDER — CABOTEGRAVIR & RILPIVIRINE ER 600 & 900 MG/3ML IM SUER: 1.0000 | INTRAMUSCULAR | 1 refills | Status: DC

## 2022-03-18 NOTE — Assessment & Plan Note (Signed)
Cmp, cbc today

## 2022-03-18 NOTE — Assessment & Plan Note (Signed)
Will check his lipid panel 

## 2022-03-18 NOTE — Assessment & Plan Note (Signed)
He is doing well, no concerns and will check his labs today. Cabenuva discussed with him and will start this once active and he will continue with Biktarvy until then.  He can follow up with me in 6-12 months

## 2022-03-18 NOTE — Progress Notes (Signed)
   Subjective:    Patient ID: Eduardo Leon, male    DOB: Sep 16, 1985, 36 y.o.   MRN: [id number]  HPI Eduardo Leon is here for his yearly follow up of HIV He has been on Biktarvy and has had no significant issues taking or tolerating his medication.  He has no complaints today and is interested in converting to IM [place] for treatment.  No other concerns.    Review of Systems  Constitutional:  Negative for fatigue.  Gastrointestinal:  Negative for diarrhea and nausea.  Skin:  Negative for rash.       Objective:   Physical Exam Eyes:     General: No scleral icterus. Pulmonary:     Effort: Pulmonary effort is normal.  Skin:    Findings: No rash.  Neurological:     Mental Status: He is alert.  Psychiatric:        Mood and Affect: Mood normal.   SH: no tobacco        Assessment & Plan:

## 2022-03-18 NOTE — Telephone Encounter (Signed)
Patient is interested in starting [place]. Currently active with Juanell Fairly. Will start application. Patient has been on Atripla and Biktarvy before with clear genotypes.   Counseled that [place] is two separate intramuscular injections in the gluteal muscle on each side for each visit. Explained that the second injection is 30 days after the initial injection then every 2 months thereafter. Discussed the need for viral load monitoring every 2 months for the first 6 months and then periodically afterwards as their provider sees the need. Discussed the rare but significant chance of developing resistance despite compliance. Explained that showing up to injection appointments is very important and warned that if 2 appointments are missed, it will be reassessed by their provider whether they are a good candidate for injection therapy. Counseled on possible side effects associated with the injections such as injection site pain, which is usually mild to moderate in nature, injection site nodules, and injection site reactions. Asked to call the clinic or send me a mychart message if they experience any issues, such as fatigue, nausea, headache, rash, or dizziness. Advised that they can take ibuprofen or tylenol for injection site pain if needed.   Margarite Gouge, PharmD, CPP, BCIDP, AAHIVP Clinical Pharmacist Practitioner Infectious Diseases Clinical Pharmacist [facility]

## 2022-03-18 NOTE — Assessment & Plan Note (Signed)
Will screen today 

## 2022-03-18 NOTE — Assessment & Plan Note (Signed)
Flu shot offered and given today 

## 2022-03-18 NOTE — Addendum Note (Signed)
Addended by: Jennette Kettle on: 03/18/2022 10:46 AM   Modules accepted: Orders

## 2022-03-19 ENCOUNTER — Telehealth: Payer: Self-pay

## 2022-03-19 LAB — T-HELPER CELL (CD4) - (RCID CLINIC ONLY)
CD4 % Helper T Cell: 29 % — ABNORMAL LOW (ref 33–65)
CD4 T Cell Abs: 644 /uL (ref 400–1790)

## 2022-03-19 LAB — URINE CYTOLOGY ANCILLARY ONLY
Chlamydia: NEGATIVE
Comment: NEGATIVE
Comment: NORMAL
Neisseria Gonorrhea: NEGATIVE

## 2022-03-19 LAB — CYTOLOGY, (ORAL, ANAL, URETHRAL) ANCILLARY ONLY
Chlamydia: NEGATIVE
Chlamydia: NEGATIVE
Comment: NEGATIVE
Comment: NEGATIVE
Comment: NORMAL
Comment: NORMAL
Neisseria Gonorrhea: NEGATIVE
Neisseria Gonorrhea: POSITIVE — AB

## 2022-03-19 NOTE — Telephone Encounter (Signed)
-----   Message from Gardiner Barefoot, MD sent at 03/19/2022  2:59 PM EST ----- Please get him in for ceftriaxone 500 mg IM x 1 for positive GC.  thanks

## 2022-03-19 NOTE — Telephone Encounter (Signed)
Spoke with patient, relayed positive gonorrhea results. He will come in tomorrow for ceftriaxone. Advised no sex until treatment completed plus an additional 7-10 days. Recommended he notify partners for testing and treatment.   Patient verbalized understanding and has no further questions.   Sandie Ano, RN

## 2022-03-20 ENCOUNTER — Other Ambulatory Visit: Payer: Self-pay

## 2022-03-20 ENCOUNTER — Encounter: Payer: Self-pay | Admitting: Pharmacist

## 2022-03-20 ENCOUNTER — Ambulatory Visit (INDEPENDENT_AMBULATORY_CARE_PROVIDER_SITE_OTHER): Payer: Self-pay

## 2022-03-20 DIAGNOSIS — A549 Gonococcal infection, unspecified: Secondary | ICD-10-CM

## 2022-03-20 LAB — COMPLETE METABOLIC PANEL WITH GFR
AG Ratio: 1.3 (calc) (ref 1.0–2.5)
ALT: 47 U/L — ABNORMAL HIGH (ref 9–46)
AST: 22 U/L (ref 10–40)
Albumin: 4.3 g/dL (ref 3.6–5.1)
Alkaline phosphatase (APISO): 106 U/L (ref 36–130)
BUN: 13 mg/dL (ref 7–25)
CO2: 28 mmol/L (ref 20–32)
Calcium: 9.5 mg/dL (ref 8.6–10.3)
Chloride: 103 mmol/L (ref 98–110)
Creat: 1.05 mg/dL (ref 0.60–1.26)
Globulin: 3.3 g/dL (calc) (ref 1.9–3.7)
Glucose, Bld: 227 mg/dL — ABNORMAL HIGH (ref 65–99)
Potassium: 4.6 mmol/L (ref 3.5–5.3)
Sodium: 138 mmol/L (ref 135–146)
Total Bilirubin: 0.4 mg/dL (ref 0.2–1.2)
Total Protein: 7.6 g/dL (ref 6.1–8.1)
eGFR: 94 mL/min/{1.73_m2} (ref >=60)

## 2022-03-20 LAB — CBC WITH DIFFERENTIAL/PLATELET
Absolute Monocytes: 541 cells/uL (ref 200–950)
Basophils Absolute: 82 cells/uL (ref 0–200)
Basophils Relative: 1.6 %
Eosinophils Absolute: 82 cells/uL (ref 15–500)
Eosinophils Relative: 1.6 %
HCT: 48.3 % (ref 38.5–50.0)
Hemoglobin: 16.9 g/dL (ref 13.2–17.1)
Lymphs Abs: 2672 cells/uL (ref 850–3900)
MCH: 31.2 pg (ref 27.0–33.0)
MCHC: 35 g/dL (ref 32.0–36.0)
MCV: 89.1 fL (ref 80.0–100.0)
MPV: 9.5 fL (ref 7.5–12.5)
Monocytes Relative: 10.6 %
Neutro Abs: 1724 cells/uL (ref 1500–7800)
Neutrophils Relative %: 33.8 %
Platelets: 347 10*3/uL (ref 140–400)
RBC: 5.42 10*6/uL (ref 4.20–5.80)
RDW: 12.1 % (ref 11.0–15.0)
Total Lymphocyte: 52.4 %
WBC: 5.1 10*3/uL (ref 3.8–10.8)

## 2022-03-20 LAB — LIPID PANEL
Cholesterol: 232 mg/dL — ABNORMAL HIGH (ref <200)
HDL: 38 mg/dL — ABNORMAL LOW (ref >=40)
LDL Cholesterol (Calc): 170 mg/dL (calc) — ABNORMAL HIGH
Non-HDL Cholesterol (Calc): 194 mg/dL (calc) — ABNORMAL HIGH (ref <130)
Total CHOL/HDL Ratio: 6.1 (calc) — ABNORMAL HIGH (ref <5.0)
Triglycerides: 116 mg/dL (ref <150)

## 2022-03-20 LAB — HIV-1 RNA QUANT-NO REFLEX-BLD
HIV 1 RNA Quant: NOT DETECTED Copies/mL
HIV-1 RNA Quant, Log: NOT DETECTED Log cps/mL

## 2022-03-20 LAB — RPR: RPR Ser Ql: NONREACTIVE

## 2022-03-20 MED ORDER — CEFTRIAXONE SODIUM 500 MG IJ SOLR
500.0000 mg | Freq: Once | INTRAMUSCULAR | Status: AC
  Administered 2022-03-20: 500 mg via INTRAMUSCULAR

## 2022-03-20 NOTE — Progress Notes (Signed)
Patient in office for ceftriaxone 500mg  x1. Patient reports only vancomycin and hydrocodone medication allergies. Per ok for patient to receive ceftriaxone injection today. Patient accepted condoms today and tolerated injection well. Janis Sol T Tammy Sours

## 2022-03-25 ENCOUNTER — Telehealth: Payer: Self-pay

## 2022-03-25 NOTE — Telephone Encounter (Signed)
[facility] Patient Advocate Encounter  Patient's medication [facility]) have been couriered to [facility] from [employer] and will be administered on the patient next office visit on 04/02/22.  Clearance Coots , CPhT Specialty Pharmacy Patient [facility] for Infectious Disease Phone: [phone] Fax:  [phone]

## 2022-04-02 ENCOUNTER — Other Ambulatory Visit: Payer: Self-pay

## 2022-04-02 ENCOUNTER — Ambulatory Visit (INDEPENDENT_AMBULATORY_CARE_PROVIDER_SITE_OTHER): Payer: Self-pay | Admitting: Pharmacist

## 2022-04-02 DIAGNOSIS — B20 Human immunodeficiency virus [HIV] disease: Secondary | ICD-10-CM

## 2022-04-02 MED ORDER — CABOTEGRAVIR & RILPIVIRINE ER 600 & 900 MG/3ML IM SUER
1.0000 | Freq: Once | INTRAMUSCULAR | Status: AC
  Administered 2022-04-02: 1 via INTRAMUSCULAR

## 2022-04-02 NOTE — Progress Notes (Signed)
HPI: Eduardo Leon is a 36 y.o. male who presents to the [place] clinic for [place] administration.  Patient Active Problem List   Diagnosis Date Noted   Gonorrhea 03/22/2021   Hyperglycemia 03/14/2020   Need for prophylactic vaccination and inoculation against influenza 03/21/2019   Medication monitoring encounter 01/25/2018   Screening examination for venereal disease 06/25/2015   Encounter for long-term (current) use of medications 06/25/2015   Syphilis in male 01/18/2014   Hemorrhoids, internal, with bleeding 08/02/2013   Depression    Human immunodeficiency virus (HIV) disease ([place]) 02/12/2010    Patient's Medications  New Prescriptions   No medications on file  Previous Medications   CABOTEGRAVIR & RILPIVIRINE ER (CABENUVA) 600 & 900 MG/3ML INJECTION    Inject 1 kit into the muscle every 30 (thirty) days.   CABOTEGRAVIR & RILPIVIRINE ER (CABENUVA) 600 & 900 MG/3ML INJECTION    Inject 1 kit into the muscle every 2 (two) months.   EPINEPHRINE (EPIPEN 2-PAK) 0.3 MG/0.3 ML IJ SOAJ INJECTION    Inject 0.3 mg into the muscle as needed for anaphylaxis.   IBUPROFEN (ADVIL) 800 MG TABLET    Take 1 tablet (800 mg total) by mouth every 8 (eight) hours as needed.  Modified Medications   No medications on file  Discontinued Medications   BIKTARVY 50-200-25 MG TABS TABLET    TAKE 1 TABLET BY MOUTH DAILY    Allergies: Allergies  Allergen Reactions   Shellfish-Derived Products Anaphylaxis, Hives and Itching   Codeine Hives   Hydrocodone     Severe itching and rash on legs and back. 20m morphine charted in 2011 - reaction unknown   Vancomycin Hives    Past Medical History: Past Medical History:  Diagnosis Date   Anxiety    Depression    After HIV Diagnosis, h/o SI; has been on Cymbalta with adverse rxns   Fibromyalgia    HIV (human immunodeficiency virus infection) ([place] Aug 2012   Contracted sexually   Migraines    Mgmt with OTC meds    Social History: Social  History   Socioeconomic History   Marital status: Single    Spouse name: Not on file   Number of children: Not on file   Years of education: Not on file   Highest education level: Not on file  Occupational History   Not on file  Tobacco Use   Smoking status: Never   Smokeless tobacco: Never  Substance and Sexual Activity   Alcohol use: No    Alcohol/week: 0.0 standard drinks of alcohol   Drug use: No   Sexual activity: Yes    Partners: Male    Birth control/protection: Condom    Comment: accepted condoms  Other Topics Concern   Not on file  Social History Narrative   Currently unemployed. has attended [employer](but not completed).  Could not finish school due to migraines. Lives alone   Social Determinants of Health   Financial Resource Strain: Not on file  Food Insecurity: Not on file  Transportation Needs: Not on file  Physical Activity: Not on file  Stress: Not on file  Social Connections: Not on file    Labs: Lab Results  Component Value Date   HIV1RNAQUANT Not Detected 03/18/2022   HIV1RNAQUANT Not Detected 03/18/2021   HIV1RNAQUANT <20 02/28/2020   CD4TABS 644 03/18/2022   CD4TABS 691 03/18/2021   CD4TABS 828 02/28/2020    RPR and STI Lab Results  Component Value Date   LABRPR  NON-REACTIVE 03/18/2022   LABRPR REACTIVE (A) 03/18/2021   LABRPR NON REACTIVE 06/05/2020   LABRPR REACTIVE (A) 02/28/2020   LABRPR Reactive (A) 01/31/2020   RPRTITER 1:1 (H) 03/18/2021   RPRTITER 1:1 (H) 02/28/2020   RPRTITER 1:1 (H) 03/03/2019   RPRTITER 1:2 (H) 01/04/2018   RPRTITER 1:64 (A) 10/28/2016    STI Results GC CT  03/18/2022 10:23 AM Negative    Negative    Positive  Negative    Negative    Negative   03/18/2021  4:20 PM Negative    Negative    Positive  Negative    Negative    Negative   02/18/2021  5:44 PM Negative  Negative   06/05/2020  7:45 PM Negative  Positive   03/14/2020 10:54 AM Negative    Negative  Negative    Negative    02/28/2020 10:45 AM Negative  Negative   01/31/2020  7:01 PM Negative  Negative   03/21/2019  9:33 AM Negative    Negative  Negative    Negative   03/03/2019 12:00 AM Negative  Negative   11/17/2018 12:00 AM Negative  Negative   01/04/2018 12:00 AM Negative  Negative   10/28/2016 12:00 AM Negative  Negative   02/11/2016 12:00 AM Negative    *Negative    *Negative  Negative    *Negative    *Negative   06/25/2015 12:00 AM Negative    * Negative    * Negative  Negative    * Negative    * Negative   05/15/2014 12:00 AM NG: Negative  CT: Negative   01/10/2014 12:00 AM NG: Negative  CT: Negative   03/03/2011  2:16 PM  POSITIVE   01/05/2009  3:49 PM  POSITIVE (NOTE)  Testing performed using the BD Probetec ET Chlamydia trachomatis and Neisseria gonorrhea amplified DNA assay.     Hepatitis B Lab Results  Component Value Date   HEPBSAB NEG 02/12/2010   HEPBSAG NEG 02/12/2010   HEPBCAB NEG 02/12/2010   Hepatitis C No results found for: "HEPCAB", "HCVRNAPCRQN" Hepatitis A Lab Results  Component Value Date   HAV NEG 02/12/2010   Lipids: Lab Results  Component Value Date   CHOL 232 (H) 03/18/2022   TRIG 116 03/18/2022   HDL 38 (L) 03/18/2022   CHOLHDL 6.1 (H) 03/18/2022   VLDL 38 (H) 06/25/2015   LDLCALC 170 (H) 03/18/2022    Current HIV Regimen: Biktarvy  TARGET DATE: The 13th of the month  Assessment: Eduardo Leon presents today for their first initiation injection for Cabenuva. Counseled that [place] is two separate intramuscular injections in the gluteal muscle on each side for each visit. Explained that the second injection is 30 days after the initial injection then every 2 months thereafter. Discussed the need for viral load monitoring every 2 months for the first 6 months and then periodically afterwards as their provider sees the need. Discussed the rare but significant chance of developing resistance despite compliance. Explained that showing up to injection  appointments is very important and warned that if 2 appointments are missed, it will be reassessed by their provider whether they are a good candidate for injection therapy. Counseled on possible side effects associated with the injections such as injection site pain, which is usually mild to moderate in nature, injection site nodules, and injection site reactions. Asked to call the clinic or send me a mychart message if they experience any issues, such as fatigue, nausea, headache, rash, or dizziness. Advised that they  can take ibuprofen or tylenol for injection site pain if needed.   Administered cabotegravir 632m/3mL in left upper outer quadrant of the gluteal muscle. Administered rilpivirine 900 mg/357min the right upper outer quadrant of the gluteal muscle. Monitored patient for 10 minutes after injection. Injections were tolerated well without issue. Counseled to stop taking Biktarvy after today's dose and to call with any issues that may arise. Will make follow up appointments for second initiation injection in 30 days and then maintenance injections every 2 months thereafter for 6 months.   Plan: - Stop BiLa [place]atanjections administered - Second initiation injection scheduled for 1/10 with me  - Maintenance injections scheduled for 3/12 with Dr. CoLinus Salmonsnd 5/7 with me  - Call with any issues or questions  AmAlfonse SprucePharmD, CPP, BCDuncanAAMeadow Gladeharmacist Practitioner Infectious Diseases [facility]

## 2022-04-24 ENCOUNTER — Telehealth: Payer: Self-pay

## 2022-04-24 NOTE — Telephone Encounter (Signed)
[facility] Patient Advocate Encounter  Patient's medications have been couriered to [facility] from [place]: [phone], and will be administered on  04/30/2022.

## 2022-04-30 ENCOUNTER — Ambulatory Visit (INDEPENDENT_AMBULATORY_CARE_PROVIDER_SITE_OTHER): Payer: Self-pay | Admitting: Pharmacist

## 2022-04-30 ENCOUNTER — Other Ambulatory Visit: Payer: Self-pay

## 2022-04-30 DIAGNOSIS — B20 Human immunodeficiency virus [HIV] disease: Secondary | ICD-10-CM

## 2022-04-30 DIAGNOSIS — Z113 Encounter for screening for infections with a predominantly sexual mode of transmission: Secondary | ICD-10-CM

## 2022-04-30 MED ORDER — CABOTEGRAVIR & RILPIVIRINE ER 600 & 900 MG/3ML IM SUER
1.0000 | Freq: Once | INTRAMUSCULAR | Status: AC
  Administered 2022-04-30: 1 via INTRAMUSCULAR

## 2022-04-30 NOTE — Progress Notes (Signed)
HPI: Montez Cuda is a 37 y.o. male who presents to the [facility] pharmacy clinic for [place] administration.  Patient Active Problem List   Diagnosis Date Noted   Gonorrhea 03/22/2021   Hyperglycemia 03/14/2020   Need for prophylactic vaccination and inoculation against influenza 03/21/2019   Medication monitoring encounter 01/25/2018   Screening examination for venereal disease 06/25/2015   Encounter for long-term (current) use of medications 06/25/2015   Syphilis in male 01/18/2014   Hemorrhoids, internal, with bleeding 08/02/2013   Depression    Human immunodeficiency virus (HIV) disease ([facility]) 02/12/2010    Patient's Medications  New Prescriptions   No medications on file  Previous Medications   CABOTEGRAVIR & RILPIVIRINE ER (CABENUVA) 600 & 900 MG/3ML INJECTION    Inject 1 kit into the muscle every 30 (thirty) days.   CABOTEGRAVIR & RILPIVIRINE ER (CABENUVA) 600 & 900 MG/3ML INJECTION    Inject 1 kit into the muscle every 2 (two) months.   EPINEPHRINE (EPIPEN 2-PAK) 0.3 MG/0.3 ML IJ SOAJ INJECTION    Inject 0.3 mg into the muscle as needed for anaphylaxis.   IBUPROFEN (ADVIL) 800 MG TABLET    Take 1 tablet (800 mg total) by mouth every 8 (eight) hours as needed.  Modified Medications   No medications on file  Discontinued Medications   No medications on file    Allergies: Allergies  Allergen Reactions   Shellfish-Derived Products Anaphylaxis, Hives and Itching   Codeine Hives   Hydrocodone     Severe itching and rash on legs and back. 2mg  morphine charted in 2011 - reaction unknown   Vancomycin Hives    Past Medical History: Past Medical History:  Diagnosis Date   Anxiety    Depression    After HIV Diagnosis, h/o SI; has been on Cymbalta with adverse rxns   Fibromyalgia    HIV (human immunodeficiency virus infection) (H[facility]) Aug 2012   Contracted sexually   Migraines    Mgmt with OTC meds    Social History: Social History   Socioeconomic History   Marital  status: Single    Spouse name: Not on file   Number of children: Not on file   Years of education: Not on file   Highest education level: Not on file  Occupational History   Not on file  Tobacco Use   Smoking status: Never   Smokeless tobacco: Never  Substance and Sexual Activity   Alcohol use: No    Alcohol/week: 0.0 standard drinks of alcohol   Drug use: No   Sexual activity: Yes    Partners: Male    Birth control/protection: Condom    Comment: accepted condoms  Other Topics Concern   Not on file  Social History Narrative   Currently unemployed. has attended [employer] (but not completed).  Could not finish school due to migraines. Lives alone   Social Determinants of Health   Financial Resource Strain: Not on file  Food Insecurity: Not on file  Transportation Needs: Not on file  Physical Activity: Not on file  Stress: Not on file  Social Connections: Not on file    Labs: Lab Results  Component Value Date   HIV1RNAQUANT Not Detected 03/18/2022   HIV1RNAQUANT Not Detected 03/18/2021   HIV1RNAQUANT <20 02/28/2020   CD4TABS 644 03/18/2022   CD4TABS 691 03/18/2021   CD4TABS 828 02/28/2020    RPR and STI Lab Results  Component Value Date   LABRPR NON-REACTIVE 03/18/2022   LABRPR REACTIVE (A) 03/18/2021  LABRPR NON REACTIVE 06/05/2020   LABRPR REACTIVE (A) 02/28/2020   LABRPR Reactive (A) 01/31/2020   RPRTITER 1:1 (H) 03/18/2021   RPRTITER 1:1 (H) 02/28/2020   RPRTITER 1:1 (H) 03/03/2019   RPRTITER 1:2 (H) 01/04/2018   RPRTITER 1:64 (A) 10/28/2016    STI Results GC CT  03/18/2022 10:23 AM Negative    Negative    Positive  Negative    Negative    Negative   03/18/2021  4:20 PM Negative    Negative    Positive  Negative    Negative    Negative   02/18/2021  5:44 PM Negative  Negative   06/05/2020  7:45 PM Negative  Positive   03/14/2020 10:54 AM Negative    Negative  Negative    Negative   02/28/2020 10:45 AM Negative  Negative    01/31/2020  7:01 PM Negative  Negative   03/21/2019  9:33 AM Negative    Negative  Negative    Negative   03/03/2019 12:00 AM Negative  Negative   11/17/2018 12:00 AM Negative  Negative   01/04/2018 12:00 AM Negative  Negative   10/28/2016 12:00 AM Negative  Negative   02/11/2016 12:00 AM Negative    *Negative    *Negative  Negative    *Negative    *Negative   06/25/2015 12:00 AM Negative    * Negative    * Negative  Negative    * Negative    * Negative   05/15/2014 12:00 AM NG: Negative  CT: Negative   01/10/2014 12:00 AM NG: Negative  CT: Negative   03/03/2011  2:16 PM  POSITIVE   01/05/2009  3:49 PM  POSITIVE (NOTE)  Testing performed using the BD Probetec ET Chlamydia trachomatis and Neisseria gonorrhea amplified DNA assay.     Hepatitis B Lab Results  Component Value Date   HEPBSAB NEG 02/12/2010   HEPBSAG NEG 02/12/2010   HEPBCAB NEG 02/12/2010   Hepatitis C No results found for: "HEPCAB", "HCVRNAPCRQN" Hepatitis A Lab Results  Component Value Date   HAV NEG 02/12/2010   Lipids: Lab Results  Component Value Date   CHOL 232 (H) 03/18/2022   TRIG 116 03/18/2022   HDL 38 (L) 03/18/2022   CHOLHDL 6.1 (H) 03/18/2022   VLDL 38 (H) 06/25/2015   LDLCALC 170 (H) 03/18/2022    TARGET DATE:  The 13th of the month  Current HIV Regimen: Cabenuva  Assessment: Carrington presents today for their maintenance Cabenuva injections. No problems with systemic effects of injections. Patient did experience significant tenderness and muscle aching for 1 week following his first injection. Reviewed that he probably was tensing his muscles while we simultaneously administered his injections. Recommended trialing one administration at a time, but he prefers same time injections. Also reviewed staying active today, continuing to use ibuprofen/Tylenol as needed, and trying hot/cold packs for relief. Felt a knot before administering his rilpivirine on the right side; reviewed this  should improve over time as his body acclimates to the new injection.   Administered cabotegravir 600mg /55mL in left upper outer quadrant of the gluteal muscle. Administered rilpivirine 900 mg/20mL in the right upper outer quadrant of the gluteal muscle. Monitored patient for 10 minutes after injection. Injections were tolerated well without issue. Patient will follow up in 2 months for next injection. Will check HIV RNA today.  Patient requests full STI screening today. States he was active with a new partner ~1 month ago and he noticed a penile lesion a  few days later that disappeared. He believes this was an inflamed hair follicle since it did not "feel like" what his previous syphilis lesion felt like. States a couple weeks later he noticed a boil and rash on the bottom of his feet which has since gone away. Agree with checking RPR along with urine/oral/rectal cytologies.   He politely declines an updated COVID vaccine or MPX vaccine series.   Plan: - Cabenuva injections administered - Check HIV RNA, RPR, and urine/oral/rectal cytologies - Next injections scheduled for 3/12 with Dr. Linus Salmons and 5/7 with me  - Call with any issues or questions  Alfonse Spruce, PharmD, CPP, [place], [place] Clinical Pharmacist Practitioner Infectious Diseases [place] for Infectious Disease

## 2022-05-01 LAB — CYTOLOGY, (ORAL, ANAL, URETHRAL) ANCILLARY ONLY
Chlamydia: NEGATIVE
Chlamydia: NEGATIVE
Comment: NEGATIVE
Comment: NEGATIVE
Comment: NORMAL
Comment: NORMAL
Neisseria Gonorrhea: NEGATIVE
Neisseria Gonorrhea: NEGATIVE

## 2022-05-01 LAB — URINE CYTOLOGY ANCILLARY ONLY
Chlamydia: NEGATIVE
Comment: NEGATIVE
Comment: NORMAL
Neisseria Gonorrhea: NEGATIVE

## 2022-05-03 LAB — HIV-1 RNA QUANT-NO REFLEX-BLD
HIV 1 RNA Quant: <20 Copies/mL — ABNORMAL HIGH
HIV-1 RNA Quant, Log: <1.3 Log cps/mL — ABNORMAL HIGH

## 2022-05-03 LAB — RPR: RPR Ser Ql: NONREACTIVE

## 2022-06-25 ENCOUNTER — Telehealth: Payer: Self-pay | Admitting: Pharmacist

## 2022-06-25 NOTE — Telephone Encounter (Signed)
Patient's specialty medication Kern Reap) was delivered from [place] and will be administered at next office visit on 3/12.  Alfonse Spruce, PharmD, CPP, BCIDP, [place] Clinical Pharmacist Practitioner Infectious [place] for Infectious Disease

## 2022-07-01 ENCOUNTER — Encounter: Payer: Self-pay | Admitting: Internal Medicine

## 2022-07-01 ENCOUNTER — Ambulatory Visit (INDEPENDENT_AMBULATORY_CARE_PROVIDER_SITE_OTHER): Payer: Self-pay | Admitting: Internal Medicine

## 2022-07-01 ENCOUNTER — Other Ambulatory Visit: Payer: Self-pay

## 2022-07-01 ENCOUNTER — Other Ambulatory Visit (HOSPITAL_COMMUNITY): Admit: 2022-07-01 | Payer: Self-pay

## 2022-07-01 VITALS — BP 148/85 | HR 73 | Temp 98.4°F | Resp 16 | Ht 67.0 in | Wt 193.6 lb

## 2022-07-01 DIAGNOSIS — B20 Human immunodeficiency virus [HIV] disease: Secondary | ICD-10-CM

## 2022-07-01 DIAGNOSIS — Z113 Encounter for screening for infections with a predominantly sexual mode of transmission: Secondary | ICD-10-CM

## 2022-07-01 DIAGNOSIS — R739 Hyperglycemia, unspecified: Secondary | ICD-10-CM

## 2022-07-01 DIAGNOSIS — Z5181 Encounter for therapeutic drug level monitoring: Secondary | ICD-10-CM

## 2022-07-01 MED ORDER — CABOTEGRAVIR & RILPIVIRINE ER 600 & 900 MG/3ML IM SUER
1.0000 | Freq: Once | INTRAMUSCULAR | Status: AC
  Administered 2022-07-01: 1 via INTRAMUSCULAR

## 2022-07-01 NOTE — Assessment & Plan Note (Signed)
Blood sugar remains up. Needs to establish with a pcp

## 2022-07-01 NOTE — Assessment & Plan Note (Signed)
Will rescreen

## 2022-07-01 NOTE — Progress Notes (Signed)
   Subjective:    Patient ID: Eduardo Leon, male    DOB: 11/15/1985, 37 y.o.   MRN: [id number]  HPI Eduardo Leon is here for follow up of HIV He continues on [place] and doing well.  No new concerns. No issues getting the mediciation.    Review of Systems  Constitutional:  Negative for fatigue.  Gastrointestinal:  Negative for diarrhea.  Skin:  Negative for rash.       Objective:   Physical Exam Eyes:     General: No scleral icterus. Pulmonary:     Effort: Pulmonary effort is normal.  Neurological:     Mental Status: He is alert.   SH: no tobacco        Assessment & Plan:

## 2022-07-01 NOTE — Assessment & Plan Note (Addendum)
Doing well overall, labs reviewed with him and has remained undetectable.   Some soreness at injection sites but prefers to stay on [place].   Has follow up in 2 months.  Anal pap screening today

## 2022-07-02 LAB — CYTOLOGY, (ORAL, ANAL, URETHRAL) ANCILLARY ONLY
Chlamydia: NEGATIVE
Chlamydia: NEGATIVE
Comment: NEGATIVE
Comment: NEGATIVE
Comment: NORMAL
Comment: NORMAL
Neisseria Gonorrhea: NEGATIVE
Neisseria Gonorrhea: NEGATIVE

## 2022-07-02 LAB — T-HELPER CELL (CD4) - (RCID CLINIC ONLY)
CD4 % Helper T Cell: 29 % — ABNORMAL LOW (ref 33–65)
CD4 T Cell Abs: 643 /uL (ref 400–1790)

## 2022-07-02 LAB — URINE CYTOLOGY ANCILLARY ONLY
Chlamydia: NEGATIVE
Comment: NEGATIVE
Comment: NORMAL
Neisseria Gonorrhea: NEGATIVE

## 2022-07-04 LAB — CBC WITH DIFFERENTIAL/PLATELET
Absolute Monocytes: 468 cells/uL (ref 200–950)
Basophils Absolute: 50 cells/uL (ref 0–200)
Basophils Relative: 1.1 %
Eosinophils Absolute: 81 cells/uL (ref 15–500)
Eosinophils Relative: 1.8 %
HCT: 47 % (ref 38.5–50.0)
Hemoglobin: 16.1 g/dL (ref 13.2–17.1)
Lymphs Abs: 2300 cells/uL (ref 850–3900)
MCH: 30.2 pg (ref 27.0–33.0)
MCHC: 34.3 g/dL (ref 32.0–36.0)
MCV: 88.2 fL (ref 80.0–100.0)
MPV: 9.1 fL (ref 7.5–12.5)
Monocytes Relative: 10.4 %
Neutro Abs: 1602 cells/uL (ref 1500–7800)
Neutrophils Relative %: 35.6 %
Platelets: 358 10*3/uL (ref 140–400)
RBC: 5.33 10*6/uL (ref 4.20–5.80)
RDW: 12.2 % (ref 11.0–15.0)
Total Lymphocyte: 51.1 %
WBC: 4.5 10*3/uL (ref 3.8–10.8)

## 2022-07-04 LAB — COMPLETE METABOLIC PANEL WITH GFR
AG Ratio: 1.3 (calc) (ref 1.0–2.5)
ALT: 51 U/L — ABNORMAL HIGH (ref 9–46)
AST: 24 U/L (ref 10–40)
Albumin: 4.4 g/dL (ref 3.6–5.1)
Alkaline phosphatase (APISO): 103 U/L (ref 36–130)
BUN: 13 mg/dL (ref 7–25)
CO2: 26 mmol/L (ref 20–32)
Calcium: 9.8 mg/dL (ref 8.6–10.3)
Chloride: 104 mmol/L (ref 98–110)
Creat: 1.04 mg/dL (ref 0.60–1.26)
Globulin: 3.4 g/dL (calc) (ref 1.9–3.7)
Glucose, Bld: 242 mg/dL — ABNORMAL HIGH (ref 65–99)
Potassium: 4.3 mmol/L (ref 3.5–5.3)
Sodium: 139 mmol/L (ref 135–146)
Total Bilirubin: 0.4 mg/dL (ref 0.2–1.2)
Total Protein: 7.8 g/dL (ref 6.1–8.1)
eGFR: 95 mL/min/{1.73_m2} (ref >=60)

## 2022-07-04 LAB — HIV-1 RNA QUANT-NO REFLEX-BLD
HIV 1 RNA Quant: <20 Copies/mL — ABNORMAL HIGH
HIV-1 RNA Quant, Log: <1.3 Log cps/mL — ABNORMAL HIGH

## 2022-07-04 LAB — RPR: RPR Ser Ql: NONREACTIVE

## 2022-07-04 LAB — CYTOLOGY - PAP: Diagnosis: NEGATIVE

## 2022-08-20 ENCOUNTER — Telehealth: Payer: Self-pay

## 2022-08-20 NOTE — Telephone Encounter (Signed)
[facility] Patient Advocate Encounter  Patient's medication [name]) have been couriered to [facility] from [employer] and will be administered on the patient next office visit on 08/26/22.  Clearance Coots , CPhT Specialty Pharmacy Patient [facility] for Infectious Disease Phone: [phone] Fax:  [phone]

## 2022-08-25 NOTE — Progress Notes (Unsigned)
HPI: Declan Mwangi is a 37 y.o. male who presents to the [facility] pharmacy clinic for [place] administration.  Patient Active Problem List   Diagnosis Date Noted   Gonorrhea 03/22/2021   Hyperglycemia 03/14/2020   Need for prophylactic vaccination and inoculation against influenza 03/21/2019   Medication monitoring encounter 01/25/2018   Screening examination for venereal disease 06/25/2015   Encounter for long-term (current) use of medications 06/25/2015   Syphilis in male 01/18/2014   Hemorrhoids, internal, with bleeding 08/02/2013   Depression    Human immunodeficiency virus (HIV) disease ([facility]) 02/12/2010    Patient's Medications  New Prescriptions   No medications on file  Previous Medications   CABOTEGRAVIR & RILPIVIRINE ER (CABENUVA) 600 & 900 MG/3ML INJECTION    Inject 1 kit into the muscle every 30 (thirty) days.   CABOTEGRAVIR & RILPIVIRINE ER (CABENUVA) 600 & 900 MG/3ML INJECTION    Inject 1 kit into the muscle every 2 (two) months.   EPINEPHRINE (EPIPEN 2-PAK) 0.3 MG/0.3 ML IJ SOAJ INJECTION    Inject 0.3 mg into the muscle as needed for anaphylaxis.   IBUPROFEN (ADVIL) 800 MG TABLET    Take 1 tablet (800 mg total) by mouth every 8 (eight) hours as needed.  Modified Medications   No medications on file  Discontinued Medications   No medications on file    Allergies: Allergies  Allergen Reactions   Shellfish-Derived Products Anaphylaxis, Hives and Itching   Codeine Hives   Hydrocodone     Severe itching and rash on legs and back. 2mg  morphine charted in 2011 - reaction unknown   Vancomycin Hives    Past Medical History: Past Medical History:  Diagnosis Date   Anxiety    Depression    After HIV Diagnosis, h/o SI; has been on Cymbalta with adverse rxns   Fibromyalgia    HIV (human immunodeficiency virus infection) (HCC) Aug 2012   Contracted sexually   Migraines    Mgmt with OTC meds    Social History: Social History   Socioeconomic History   Marital  status: Single    Spouse name: Not on file   Number of children: Not on file   Years of education: Not on file   Highest education level: Not on file  Occupational History   Not on file  Tobacco Use   Smoking status: Never   Smokeless tobacco: Never  Substance and Sexual Activity   Alcohol use: No    Alcohol/week: 0.0 standard drinks of alcohol   Drug use: No   Sexual activity: Yes    Partners: Male    Birth control/protection: Condom    Comment: accepted condoms  Other Topics Concern   Not on file  Social History Narrative   Currently unemployed. has attended [employer] (but not completed).  Could not finish school due to migraines. Lives alone   Social Determinants of Health   Financial Resource Strain: Not on file  Food Insecurity: Not on file  Transportation Needs: Not on file  Physical Activity: Not on file  Stress: Not on file  Social Connections: Not on file    Labs: Lab Results  Component Value Date   HIV1RNAQUANT <20 (H) 07/01/2022   HIV1RNAQUANT <20 (H) 04/30/2022   HIV1RNAQUANT Not Detected 03/18/2022   CD4TABS 643 07/01/2022   CD4TABS 644 03/18/2022   CD4TABS 691 03/18/2021    RPR and STI Lab Results  Component Value Date   LABRPR NON-REACTIVE 07/01/2022   LABRPR NON-REACTIVE 04/30/2022  LABRPR NON-REACTIVE 03/18/2022   LABRPR REACTIVE (A) 03/18/2021   LABRPR NON REACTIVE 06/05/2020   RPRTITER 1:1 (H) 03/18/2021   RPRTITER 1:1 (H) 02/28/2020   RPRTITER 1:1 (H) 03/03/2019   RPRTITER 1:2 (H) 01/04/2018   RPRTITER 1:64 (A) 10/28/2016    STI Results GC CT  07/01/2022 10:06 AM Negative    Negative    Negative  Negative    Negative    Negative   04/30/2022 10:41 AM Negative    Negative    Negative  Negative    Negative    Negative   03/18/2022 10:23 AM Negative    Negative    Positive  Negative    Negative    Negative   03/18/2021  4:20 PM Negative    Negative    Positive  Negative    Negative    Negative   02/18/2021  5:44  PM Negative  Negative   06/05/2020  7:45 PM Negative  Positive   03/14/2020 10:54 AM Negative    Negative  Negative    Negative   02/28/2020 10:45 AM Negative  Negative   01/31/2020  7:01 PM Negative  Negative   03/21/2019  9:33 AM Negative    Negative  Negative    Negative   03/03/2019 12:00 AM Negative  Negative   11/17/2018 12:00 AM Negative  Negative   01/04/2018 12:00 AM Negative  Negative   10/28/2016 12:00 AM Negative  Negative   02/11/2016 12:00 AM Negative    *Negative    *Negative  Negative    *Negative    *Negative   06/25/2015 12:00 AM Negative    * Negative    * Negative  Negative    * Negative    * Negative   05/15/2014 12:00 AM NG: Negative  CT: Negative   01/10/2014 12:00 AM NG: Negative  CT: Negative   03/03/2011  2:16 PM  POSITIVE   01/05/2009  3:49 PM  POSITIVE (NOTE)  Testing performed using the BD Probetec ET Chlamydia trachomatis and Neisseria gonorrhea amplified DNA assay.     Hepatitis B Lab Results  Component Value Date   HEPBSAB NEG 02/12/2010   HEPBSAG NEG 02/12/2010   HEPBCAB NEG 02/12/2010   Hepatitis C No results found for: "HEPCAB", "HCVRNAPCRQN" Hepatitis A Lab Results  Component Value Date   HAV NEG 02/12/2010   Lipids: Lab Results  Component Value Date   CHOL 232 (H) 03/18/2022   TRIG 116 03/18/2022   HDL 38 (L) 03/18/2022   CHOLHDL 6.1 (H) 03/18/2022   VLDL 38 (H) 06/25/2015   LDLCALC 170 (H) 03/18/2022    TARGET DATE: the 13th of the month  Assessment: Mikhail presents today for their maintenance Cabenuva injections. Past injections were tolerated well without issues. Most recent viral load was undetectable and CD4 count was 643 in 06/2022. STI screenings were negative in 06/2022.   Administered cabotegravir 600mg /32mL in left upper outer quadrant of the gluteal muscle. Administered rilpivirine 900 mg/62mL in the right upper outer quadrant of the gluteal muscle. No issues with injections. *** will follow up in 2 months  for next set of injections.  Plan: - Cabenuva injections administered - Next injections scheduled for *** - Call with any issues or questions  Cassie L. Kuppelweiser, PharmD, BCIDP, AAHIVP, CPP Clinical Pharmacist Practitioner Infectious Diseases Clinical Pharmacist [facility] for [facility]

## 2022-08-26 ENCOUNTER — Other Ambulatory Visit: Payer: Self-pay

## 2022-08-26 ENCOUNTER — Ambulatory Visit (INDEPENDENT_AMBULATORY_CARE_PROVIDER_SITE_OTHER): Payer: Self-pay | Admitting: Pharmacist

## 2022-08-26 DIAGNOSIS — B20 Human immunodeficiency virus [HIV] disease: Secondary | ICD-10-CM

## 2022-08-26 DIAGNOSIS — Z23 Encounter for immunization: Secondary | ICD-10-CM

## 2022-08-26 DIAGNOSIS — Z113 Encounter for screening for infections with a predominantly sexual mode of transmission: Secondary | ICD-10-CM

## 2022-08-26 MED ORDER — CABOTEGRAVIR & RILPIVIRINE ER 600 & 900 MG/3ML IM SUER
1.0000 | Freq: Once | INTRAMUSCULAR | Status: AC
  Administered 2022-08-26: 1 via INTRAMUSCULAR

## 2022-08-27 LAB — URINE CYTOLOGY ANCILLARY ONLY
Chlamydia: NEGATIVE
Comment: NEGATIVE
Comment: NORMAL
Neisseria Gonorrhea: NEGATIVE

## 2022-08-27 LAB — CYTOLOGY, (ORAL, ANAL, URETHRAL) ANCILLARY ONLY
Chlamydia: NEGATIVE
Chlamydia: NEGATIVE
Comment: NEGATIVE
Comment: NEGATIVE
Comment: NORMAL
Comment: NORMAL
Neisseria Gonorrhea: NEGATIVE
Neisseria Gonorrhea: NEGATIVE

## 2022-08-29 LAB — HIV-1 RNA QUANT-NO REFLEX-BLD
HIV 1 RNA Quant: NOT DETECTED Copies/mL
HIV-1 RNA Quant, Log: NOT DETECTED Log cps/mL

## 2022-08-29 LAB — RPR: RPR Ser Ql: NONREACTIVE

## 2022-08-29 LAB — HEPATITIS A ANTIBODY, TOTAL: Hepatitis A AB,Total: REACTIVE — AB

## 2022-08-29 LAB — HEPATITIS B SURFACE ANTIBODY,QUALITATIVE: Hep B S Ab: NONREACTIVE

## 2022-10-22 ENCOUNTER — Telehealth: Payer: Self-pay | Admitting: Pharmacist

## 2022-10-22 NOTE — Telephone Encounter (Signed)
Patient's specialty medication Eduardo Leon) was delivered from [facility] and will be administered at next office visit on 7/10.  Eduardo Leon, PharmD, CPP, BCIDP, AAHIVP Clinical Pharmacist Practitioner Infectious Diseases Clinical Pharmacist [facility] for Infectious Disease

## 2022-10-29 ENCOUNTER — Other Ambulatory Visit: Payer: Self-pay

## 2022-10-29 ENCOUNTER — Ambulatory Visit: Payer: BLUE CROSS/BLUE SHIELD | Admitting: Pharmacist

## 2022-10-29 ENCOUNTER — Other Ambulatory Visit (HOSPITAL_COMMUNITY)
Admission: RE | Admit: 2022-10-29 | Discharge: 2022-10-29 | Disposition: A | Discharge status: Home / Self Care | Payer: BLUE CROSS/BLUE SHIELD | Source: Ambulatory Visit | Attending: Infectious Disease | Admitting: Infectious Disease

## 2022-10-29 DIAGNOSIS — Z113 Encounter for screening for infections with a predominantly sexual mode of transmission: Secondary | ICD-10-CM | POA: Insufficient documentation

## 2022-10-29 DIAGNOSIS — B20 Human immunodeficiency virus [HIV] disease: Secondary | ICD-10-CM | POA: Diagnosis not present

## 2022-10-29 MED ORDER — CABOTEGRAVIR & RILPIVIRINE ER 600 & 900 MG/3ML IM SUER
1.0000 | Freq: Once | INTRAMUSCULAR | Status: AC
Start: 2022-10-29 — End: 2022-10-29
  Administered 2022-10-29: 1 via INTRAMUSCULAR

## 2022-10-29 NOTE — Progress Notes (Signed)
HPI: Eduardo Leon is a 37 y.o. male who presents to the [facility] pharmacy clinic for [place] administration.  Patient Active Problem List   Diagnosis Date Noted   Gonorrhea 03/22/2021   Hyperglycemia 03/14/2020   Need for prophylactic vaccination and inoculation against influenza 03/21/2019   Medication monitoring encounter 01/25/2018   Screening examination for venereal disease 06/25/2015   Encounter for long-term (current) use of medications 06/25/2015   Syphilis in male 01/18/2014   Hemorrhoids, internal, with bleeding 08/02/2013   Depression    Human immunodeficiency virus (HIV) disease (HCC) 02/12/2010    Patient's Medications  New Prescriptions   No medications on file  Previous Medications   CABOTEGRAVIR & RILPIVIRINE ER (CABENUVA) 600 & 900 MG/3ML INJECTION    Inject 1 kit into the muscle every 30 (thirty) days.   CABOTEGRAVIR & RILPIVIRINE ER (CABENUVA) 600 & 900 MG/3ML INJECTION    Inject 1 kit into the muscle every 2 (two) months.   EPINEPHRINE (EPIPEN 2-PAK) 0.3 MG/0.3 ML IJ SOAJ INJECTION    Inject 0.3 mg into the muscle as needed for anaphylaxis.   IBUPROFEN (ADVIL) 800 MG TABLET    Take 1 tablet (800 mg total) by mouth every 8 (eight) hours as needed.  Modified Medications   No medications on file  Discontinued Medications   No medications on file    Allergies: Allergies  Allergen Reactions   Shellfish-Derived Products Anaphylaxis, Hives and Itching   Codeine Hives   Hydrocodone     Severe itching and rash on legs and back. 2mg  morphine charted in 2011 - reaction unknown   Vancomycin Hives    Past Medical History: Past Medical History:  Diagnosis Date   Anxiety    Depression    After HIV Diagnosis, h/o SI; has been on Cymbalta with adverse rxns   Fibromyalgia    HIV (human immunodeficiency virus infection) (HCC) Aug 2012   Contracted sexually   Migraines    Mgmt with OTC meds    Social History: Social History   Socioeconomic History   Marital  status: Single    Spouse name: Not on file   Number of children: Not on file   Years of education: Not on file   Highest education level: Not on file  Occupational History   Not on file  Tobacco Use   Smoking status: Never   Smokeless tobacco: Never  Substance and Sexual Activity   Alcohol use: No    Alcohol/week: 0.0 standard drinks of alcohol   Drug use: No   Sexual activity: Yes    Partners: Male    Birth control/protection: Condom    Comment: accepted condoms  Other Topics Concern   Not on file  Social History Narrative   Currently unemployed. has attended [employer] (but not completed).  Could not finish school due to migraines. Lives alone   Social Determinants of Health   Financial Resource Strain: Not on file  Food Insecurity: Not on file  Transportation Needs: Not on file  Physical Activity: Not on file  Stress: Not on file  Social Connections: Not on file    Labs: Lab Results  Component Value Date   HIV1RNAQUANT Not Detected 08/26/2022   HIV1RNAQUANT <20 (H) 07/01/2022   HIV1RNAQUANT <20 (H) 04/30/2022   CD4TABS 643 07/01/2022   CD4TABS 644 03/18/2022   CD4TABS 691 03/18/2021    RPR and STI Lab Results  Component Value Date   LABRPR NON-REACTIVE 08/26/2022   LABRPR NON-REACTIVE 07/01/2022  LABRPR NON-REACTIVE 04/30/2022   LABRPR NON-REACTIVE 03/18/2022   LABRPR REACTIVE (A) 03/18/2021   RPRTITER 1:1 (H) 03/18/2021   RPRTITER 1:1 (H) 02/28/2020   RPRTITER 1:1 (H) 03/03/2019   RPRTITER 1:2 (H) 01/04/2018   RPRTITER 1:64 (A) 10/28/2016    STI Results GC CT  08/26/2022 11:38 AM Negative    Negative    Negative  Negative    Negative    Negative   07/01/2022 10:06 AM Negative    Negative    Negative  Negative    Negative    Negative   04/30/2022 10:41 AM Negative    Negative    Negative  Negative    Negative    Negative   03/18/2022 10:23 AM Negative    Negative    Positive  Negative    Negative    Negative   03/18/2021  4:20  PM Negative    Negative    Positive  Negative    Negative    Negative   02/18/2021  5:44 PM Negative  Negative   06/05/2020  7:45 PM Negative  Positive   03/14/2020 10:54 AM Negative    Negative  Negative    Negative   02/28/2020 10:45 AM Negative  Negative   01/31/2020  7:01 PM Negative  Negative   03/21/2019  9:33 AM Negative    Negative  Negative    Negative   03/03/2019 12:00 AM Negative  Negative   11/17/2018 12:00 AM Negative  Negative   01/04/2018 12:00 AM Negative  Negative   10/28/2016 12:00 AM Negative  Negative   02/11/2016 12:00 AM Negative    *Negative    *Negative  Negative    *Negative    *Negative   06/25/2015 12:00 AM Negative    * Negative    * Negative  Negative    * Negative    * Negative   05/15/2014 12:00 AM NG: Negative  CT: Negative   01/10/2014 12:00 AM NG: Negative  CT: Negative   03/03/2011  2:16 PM  POSITIVE     Hepatitis B Lab Results  Component Value Date   HEPBSAB NON-REACTIVE 08/26/2022   HEPBSAG NEG 02/12/2010   HEPBCAB NEG 02/12/2010   Hepatitis C No results found for: "HEPCAB", "HCVRNAPCRQN" Hepatitis A Lab Results  Component Value Date   HAV REACTIVE (A) 08/26/2022   Lipids: Lab Results  Component Value Date   CHOL 232 (H) 03/18/2022   TRIG 116 03/18/2022   HDL 38 (L) 03/18/2022   CHOLHDL 6.1 (H) 03/18/2022   VLDL 38 (H) 06/25/2015   LDLCALC 170 (H) 03/18/2022    TARGET DATE:  The 13th of the month  Current HIV Regimen: Cabenuva  Assessment: Eduardo Leon presents today for their maintenance Cabenuva injections. Initial/past injections were tolerated well without issues. No problems with systemic effects of injections. Patient continues to experience soreness for at least 1 week after injections despite taking Tylenol and/or ibuprofen. Recommended remaining active today and provided cold packs for some additional relief. Patient denies any further GI symptoms or "feverish" feeling after injections now.    Administered cabotegravir 600mg /59mL in left upper outer quadrant of the gluteal muscle. Administered rilpivirine 900 mg/71mL in the right upper outer quadrant of the gluteal muscle. Monitored patient for 10 minutes after injection. Injections were tolerated well without issue. Patient will follow up in 2 months for next injection. Will check HIV RNA today along with full STI screening per his request despite no new partners.  Due for  hepatitis B vaccine series; would like to research and defer at this time.  Plan: - Cabenuva injections administered - Check HIV RNA, RPR, and urine/oral/rectal cytologies - Next injections scheduled for 9/10 with Dr. Luciana Axe and 11/6 with me  - Call with any issues or questions  Margarite Gouge, PharmD, CPP, BCIDP, AAHIVP Clinical Pharmacist Practitioner Infectious Diseases Clinical Pharmacist [facility]

## 2022-10-30 LAB — CYTOLOGY, (ORAL, ANAL, URETHRAL) ANCILLARY ONLY
Chlamydia: NEGATIVE
Chlamydia: NEGATIVE
Comment: NEGATIVE
Comment: NEGATIVE
Comment: NORMAL
Comment: NORMAL
Neisseria Gonorrhea: NEGATIVE
Neisseria Gonorrhea: NEGATIVE

## 2022-10-30 LAB — URINE CYTOLOGY ANCILLARY ONLY
Chlamydia: NEGATIVE
Comment: NEGATIVE
Comment: NORMAL
Neisseria Gonorrhea: NEGATIVE

## 2022-10-31 LAB — HIV-1 RNA QUANT-NO REFLEX-BLD
HIV 1 RNA Quant: NOT DETECTED Copies/mL
HIV-1 RNA Quant, Log: NOT DETECTED Log cps/mL

## 2022-10-31 LAB — RPR: RPR Ser Ql: NONREACTIVE

## 2022-12-25 ENCOUNTER — Telehealth: Payer: Self-pay

## 2022-12-25 ENCOUNTER — Other Ambulatory Visit (HOSPITAL_COMMUNITY): Payer: Self-pay

## 2022-12-25 NOTE — Telephone Encounter (Signed)
[facility] Patient Advocate Encounter   Received notification from [facility] that prior authorization for Eduardo Leon is required. (Medical Benefits)   PA submitted on 12/25/22 through [facility] Portal [facility] [id number] Status is pending    [facility] will continue to follow.   Clearance Coots, CPhT Specialty Pharmacy Patient [facility] Phone: [phone] Fax:  [phone]

## 2022-12-26 ENCOUNTER — Telehealth: Payer: Self-pay

## 2022-12-26 NOTE — Telephone Encounter (Signed)
Pharmacy Patient Advocate Encounter- Eduardo Leon BIV-Medical Benefit:  J code: [id number]  CPT code: 60454  Dx Code: B20  NO PA is required through BCBS [facility]). Case # [id number]  Notification will be in Media

## 2022-12-30 ENCOUNTER — Other Ambulatory Visit: Payer: Self-pay

## 2022-12-30 ENCOUNTER — Encounter: Payer: Self-pay | Admitting: Internal Medicine

## 2022-12-30 ENCOUNTER — Ambulatory Visit (INDEPENDENT_AMBULATORY_CARE_PROVIDER_SITE_OTHER): Payer: BLUE CROSS/BLUE SHIELD | Admitting: Internal Medicine

## 2022-12-30 VITALS — BP 137/76 | HR 72 | Resp 16 | Ht 67.0 in | Wt 193.0 lb

## 2022-12-30 DIAGNOSIS — Z79899 Other long term (current) drug therapy: Secondary | ICD-10-CM

## 2022-12-30 DIAGNOSIS — B20 Human immunodeficiency virus [HIV] disease: Secondary | ICD-10-CM

## 2022-12-30 DIAGNOSIS — Z113 Encounter for screening for infections with a predominantly sexual mode of transmission: Secondary | ICD-10-CM

## 2022-12-30 MED ORDER — CABOTEGRAVIR & RILPIVIRINE ER 600 & 900 MG/3ML IM SUER
1.0000 | Freq: Once | INTRAMUSCULAR | Status: AC
Start: 2022-12-30 — End: 2022-12-30
  Administered 2022-12-30: 1 via INTRAMUSCULAR

## 2022-12-30 NOTE — Progress Notes (Signed)
   Subjective:    Patient ID: Eduardo Leon, male    DOB: Apr 18, 1986, 37 y.o.   MRN: [id number]  HPI Eduardo Leon is here for follow up of HIV He continues on [place] and happy with it.  Continues to have some injection site reactions but no interest in changing back.  He is keeping active.     Review of Systems  Constitutional:  Negative for fatigue.  Gastrointestinal:  Negative for diarrhea and nausea.  Skin:  Negative for rash.       Objective:   Physical Exam Eyes:     General: No scleral icterus. Skin:    Findings: No rash.  Neurological:     Mental Status: He is alert.   Sh: no tobacco        Assessment & Plan:

## 2022-12-30 NOTE — Assessment & Plan Note (Signed)
Will check lipid panel. 

## 2022-12-30 NOTE — Assessment & Plan Note (Signed)
He continues to do well, no changes indicated.   Will check his labs today Follow up with pharmacy

## 2022-12-30 NOTE — Assessment & Plan Note (Signed)
Will screen 

## 2022-12-30 NOTE — Addendum Note (Signed)
Addended by: Clayborne Artist A on: 12/30/2022 03:30 PM   Modules accepted: Orders

## 2022-12-31 LAB — CYTOLOGY, (ORAL, ANAL, URETHRAL) ANCILLARY ONLY
Chlamydia: NEGATIVE
Chlamydia: NEGATIVE
Comment: NEGATIVE
Comment: NEGATIVE
Comment: NORMAL
Comment: NORMAL
Neisseria Gonorrhea: NEGATIVE
Neisseria Gonorrhea: NEGATIVE

## 2022-12-31 LAB — URINE CYTOLOGY ANCILLARY ONLY
Chlamydia: NEGATIVE
Comment: NEGATIVE
Comment: NORMAL
Neisseria Gonorrhea: NEGATIVE

## 2023-01-01 LAB — LIPID PANEL
Cholesterol: 125 mg/dL (ref <200)
HDL: 44 mg/dL (ref >=40)
LDL Cholesterol (Calc): 67 mg/dL
Non-HDL Cholesterol (Calc): 81 mg/dL (ref <130)
Total CHOL/HDL Ratio: 2.8 (calc) (ref <5.0)
Triglycerides: 59 mg/dL (ref <150)

## 2023-01-01 LAB — HIV-1 RNA QUANT-NO REFLEX-BLD
HIV 1 RNA Quant: NOT DETECTED {copies}/mL
HIV-1 RNA Quant, Log: NOT DETECTED {Log_copies}/mL

## 2023-01-01 LAB — T-HELPER CELL (CD4) - (RCID CLINIC ONLY)
CD4 % Helper T Cell: 31 % — ABNORMAL LOW (ref 33–65)
CD4 T Cell Abs: 796 /uL (ref 400–1790)

## 2023-01-01 LAB — RPR: RPR Ser Ql: NONREACTIVE

## 2023-02-24 NOTE — Progress Notes (Unsigned)
HPI: Eduardo Leon is a 37 y.o. male who presents to the [facility] pharmacy clinic for [place] administration.  Patient Active Problem List   Diagnosis Date Noted   Hyperglycemia 03/14/2020   Need for prophylactic vaccination and inoculation against influenza 03/21/2019   Medication monitoring encounter 01/25/2018   Screening examination for venereal disease 06/25/2015   Encounter for long-term (current) use of medications 06/25/2015   Syphilis in male 01/18/2014   Hemorrhoids, internal, with bleeding 08/02/2013   Depression    Human immunodeficiency virus (HIV) disease (HCC) 02/12/2010    Patient's Medications  New Prescriptions   No medications on file  Previous Medications   CABOTEGRAVIR & RILPIVIRINE ER (CABENUVA) 600 & 900 MG/3ML INJECTION    Inject 1 kit into the muscle every 30 (thirty) days.   CABOTEGRAVIR & RILPIVIRINE ER (CABENUVA) 600 & 900 MG/3ML INJECTION    Inject 1 kit into the muscle every 2 (two) months.   EPINEPHRINE (EPIPEN 2-PAK) 0.3 MG/0.3 ML IJ SOAJ INJECTION    Inject 0.3 mg into the muscle as needed for anaphylaxis.   IBUPROFEN (ADVIL) 800 MG TABLET    Take 1 tablet (800 mg total) by mouth every 8 (eight) hours as needed.  Modified Medications   No medications on file  Discontinued Medications   No medications on file    Allergies: Allergies  Allergen Reactions   Shellfish-Derived Products Anaphylaxis, Hives and Itching   Codeine Hives   Hydrocodone     Severe itching and rash on legs and back. 2mg  morphine charted in 2011 - reaction unknown   Vancomycin Hives    Labs: Lab Results  Component Value Date   HIV1RNAQUANT Not Detected 12/30/2022   HIV1RNAQUANT Not Detected 10/29/2022   HIV1RNAQUANT Not Detected 08/26/2022   CD4TABS 796 12/30/2022   CD4TABS 643 07/01/2022   CD4TABS 644 03/18/2022    RPR and STI Lab Results  Component Value Date   LABRPR NON-REACTIVE 12/30/2022   LABRPR NON-REACTIVE 10/29/2022   LABRPR NON-REACTIVE 08/26/2022    LABRPR NON-REACTIVE 07/01/2022   LABRPR NON-REACTIVE 04/30/2022   RPRTITER 1:1 (H) 03/18/2021   RPRTITER 1:1 (H) 02/28/2020   RPRTITER 1:1 (H) 03/03/2019   RPRTITER 1:2 (H) 01/04/2018   RPRTITER 1:64 (A) 10/28/2016    STI Results GC CT  12/30/2022 10:54 AM Negative    Negative    Negative  Negative    Negative    Negative   10/29/2022 11:11 AM Negative    Negative    Negative  Negative    Negative    Negative   08/26/2022 11:38 AM Negative    Negative    Negative  Negative    Negative    Negative   07/01/2022 10:06 AM Negative    Negative    Negative  Negative    Negative    Negative   04/30/2022 10:41 AM Negative    Negative    Negative  Negative    Negative    Negative   03/18/2022 10:23 AM Negative    Negative    Positive  Negative    Negative    Negative   03/18/2021  4:20 PM Negative    Negative    Positive  Negative    Negative    Negative   02/18/2021  5:44 PM Negative  Negative   06/05/2020  7:45 PM Negative  Positive   03/14/2020 10:54 AM Negative    Negative  Negative    Negative   02/28/2020 10:45 AM Negative  Negative   01/31/2020  7:01 PM Negative  Negative   03/21/2019  9:33 AM Negative    Negative  Negative    Negative   03/03/2019 12:00 AM Negative  Negative   11/17/2018 12:00 AM Negative  Negative   01/04/2018 12:00 AM Negative  Negative   10/28/2016 12:00 AM Negative  Negative   02/11/2016 12:00 AM Negative    *Negative    *Negative  Negative    *Negative    *Negative   06/25/2015 12:00 AM Negative    * Negative    * Negative  Negative    * Negative    * Negative   05/15/2014 12:00 AM NG: Negative  CT: Negative     Hepatitis B Lab Results  Component Value Date   HEPBSAB NON-REACTIVE 08/26/2022   HEPBSAG NEG 02/12/2010   HEPBCAB NEG 02/12/2010   Hepatitis C No results found for: "HEPCAB", "HCVRNAPCRQN" Hepatitis A Lab Results  Component Value Date   HAV REACTIVE (A) 08/26/2022   Lipids: Lab Results   Component Value Date   CHOL 125 12/30/2022   TRIG 59 12/30/2022   HDL 44 12/30/2022   CHOLHDL 2.8 12/30/2022   VLDL 38 (H) 06/25/2015   LDLCALC 67 12/30/2022    TARGET DATE: 13th of the month  Assessment: Eduardo Leon presents today for their maintenance Cabenuva injections. Past injections were tolerated well without issues. Last provider visit 12/30/22 with Dr. Luciana Axe. He will be due to see Dr. Luciana Axe again in March. Last viral load was 12/30/22, will plan for next with Dr. Luciana Axe. He agrees to STI testing today.  Patients experiences pain in legs for 3-5 days after the shots. Premedicating with ibuprofen/advil 30 minutes prior to appointment. We've adjusted administration from standing to prone on exam table today, and will see if this helps at all. Eduardo Leon will follow up with any concerns in this regard.  Immunizations: Due for COVID, influenza. Will be due for updated [place] 06/2024. He is amendable to influenza today, but will defer COVID.  Administered cabotegravir 600mg /17mL in left upper outer quadrant of the gluteal muscle. Administered rilpivirine 900 mg/18mL in the right upper outer quadrant of the gluteal muscle. No issues with injections. Eduardo Leon will follow up in 2 months for next set of injections.  Plan: - Cabenuva injections administered - STI testing: RPR, oral/rectal/urine cytologies for gonorrhea/chlamydia - Immunizations: Influenza in office today - Next injections scheduled for 04/30/23 with Marchelle Folks, and 06/26/23 with Dr. Luciana Axe - Call with any issues or questions  Lora Paula, PharmD PGY-2 Infectious Diseases Pharmacy Resident [facility] 02/25/2023 10:43 AM

## 2023-02-25 ENCOUNTER — Ambulatory Visit: Payer: BLUE CROSS/BLUE SHIELD | Admitting: Pharmacist

## 2023-02-25 ENCOUNTER — Other Ambulatory Visit (HOSPITAL_COMMUNITY)
Admission: RE | Admit: 2023-02-25 | Discharge: 2023-02-25 | Disposition: A | Discharge status: Home / Self Care | Payer: BLUE CROSS/BLUE SHIELD | Source: Ambulatory Visit | Attending: Internal Medicine | Admitting: Internal Medicine

## 2023-02-25 ENCOUNTER — Other Ambulatory Visit: Payer: Self-pay

## 2023-02-25 DIAGNOSIS — Z113 Encounter for screening for infections with a predominantly sexual mode of transmission: Secondary | ICD-10-CM | POA: Diagnosis present

## 2023-02-25 DIAGNOSIS — Z23 Encounter for immunization: Secondary | ICD-10-CM

## 2023-02-25 DIAGNOSIS — B20 Human immunodeficiency virus [HIV] disease: Secondary | ICD-10-CM | POA: Diagnosis not present

## 2023-02-25 MED ORDER — CABOTEGRAVIR & RILPIVIRINE ER 600 & 900 MG/3ML IM SUER
1.0000 | Freq: Once | INTRAMUSCULAR | Status: AC
Start: 2023-02-25 — End: 2023-02-25
  Administered 2023-02-25: 1 via INTRAMUSCULAR

## 2023-02-26 LAB — CYTOLOGY, (ORAL, ANAL, URETHRAL) ANCILLARY ONLY
Chlamydia: NEGATIVE
Chlamydia: NEGATIVE
Comment: NEGATIVE
Comment: NEGATIVE
Comment: NORMAL
Comment: NORMAL
Neisseria Gonorrhea: NEGATIVE
Neisseria Gonorrhea: NEGATIVE

## 2023-02-26 LAB — URINE CYTOLOGY ANCILLARY ONLY
Chlamydia: NEGATIVE
Comment: NEGATIVE
Comment: NORMAL
Neisseria Gonorrhea: NEGATIVE

## 2023-02-26 LAB — RPR: RPR Ser Ql: NONREACTIVE

## 2023-04-13 ENCOUNTER — Other Ambulatory Visit (HOSPITAL_COMMUNITY): Payer: Self-pay

## 2023-04-23 ENCOUNTER — Other Ambulatory Visit (HOSPITAL_COMMUNITY): Payer: Self-pay

## 2023-04-28 ENCOUNTER — Other Ambulatory Visit (HOSPITAL_COMMUNITY): Payer: Self-pay

## 2023-04-29 ENCOUNTER — Other Ambulatory Visit (HOSPITAL_COMMUNITY): Payer: Self-pay

## 2023-04-30 ENCOUNTER — Other Ambulatory Visit (HOSPITAL_COMMUNITY): Payer: Self-pay

## 2023-04-30 ENCOUNTER — Encounter: Payer: BLUE CROSS/BLUE SHIELD | Admitting: Pharmacist

## 2023-04-30 NOTE — Progress Notes (Unsigned)
 HPI: Eduardo Leon is a 38 y.o. male who presents to the [facility] pharmacy clinic for Cabenuva  administration.  Patient Active Problem List   Diagnosis Date Noted   Hyperglycemia 03/14/2020   Need for prophylactic vaccination and inoculation against influenza 03/21/2019   Medication monitoring encounter 01/25/2018   Screening examination for venereal disease 06/25/2015   Encounter for long-term (current) use of medications 06/25/2015   Syphilis in male 01/18/2014   Hemorrhoids, internal, with bleeding 08/02/2013   Depression    Human immunodeficiency virus (HIV) disease (HCC) 02/12/2010    Patient's Medications  New Prescriptions   No medications on file  Previous Medications   CABOTEGRAVIR  & RILPIVIRINE  ER (CABENUVA ) 600 & 900 MG/3ML INJECTION    Inject 1 kit into the muscle every 30 (thirty) days.   CABOTEGRAVIR  & RILPIVIRINE  ER (CABENUVA ) 600 & 900 MG/3ML INJECTION    Inject 1 kit into the muscle every 2 (two) months.   EPINEPHRINE  (EPIPEN  2-PAK) 0.3 MG/0.3 ML IJ SOAJ INJECTION    Inject 0.3 mg into the muscle as needed for anaphylaxis.   IBUPROFEN  (ADVIL ) 800 MG TABLET    Take 1 tablet (800 mg total) by mouth every 8 (eight) hours as needed.  Modified Medications   No medications on file  Discontinued Medications   No medications on file    Allergies: Allergies  Allergen Reactions   Shellfish-Derived Products Anaphylaxis, Hives and Itching   Codeine Hives   Hydrocodone      Severe itching and rash on legs and back. 2mg  morphine  charted in 2011 - reaction unknown   Vancomycin  Hives    Labs: Lab Results  Component Value Date   HIV1RNAQUANT Not Detected 12/30/2022   HIV1RNAQUANT Not Detected 10/29/2022   HIV1RNAQUANT Not Detected 08/26/2022   CD4TABS 796 12/30/2022   CD4TABS 643 07/01/2022   CD4TABS 644 03/18/2022    RPR and STI Lab Results  Component Value Date   LABRPR NON-REACTIVE 02/25/2023   LABRPR NON-REACTIVE 12/30/2022   LABRPR NON-REACTIVE 10/29/2022    LABRPR NON-REACTIVE 08/26/2022   LABRPR NON-REACTIVE 07/01/2022   RPRTITER 1:1 (H) 03/18/2021   RPRTITER 1:1 (H) 02/28/2020   RPRTITER 1:1 (H) 03/03/2019   RPRTITER 1:2 (H) 01/04/2018   RPRTITER 1:64 (A) 10/28/2016    STI Results GC CT  02/25/2023 11:01 AM Negative    Negative    Negative  Negative    Negative    Negative   12/30/2022 10:54 AM Negative    Negative    Negative  Negative    Negative    Negative   10/29/2022 11:11 AM Negative    Negative    Negative  Negative    Negative    Negative   08/26/2022 11:38 AM Negative    Negative    Negative  Negative    Negative    Negative   07/01/2022 10:06 AM Negative    Negative    Negative  Negative    Negative    Negative   04/30/2022 10:41 AM Negative    Negative    Negative  Negative    Negative    Negative   03/18/2022 10:23 AM Negative    Negative    Positive  Negative    Negative    Negative   03/18/2021  4:20 PM Negative    Negative    Positive  Negative    Negative    Negative   02/18/2021  5:44 PM Negative  Negative   06/05/2020  7:45 PM Negative  Positive   03/14/2020 10:54 AM Negative    Negative  Negative    Negative   02/28/2020 10:45 AM Negative  Negative   01/31/2020  7:01 PM Negative  Negative   03/21/2019  9:33 AM Negative    Negative  Negative    Negative   03/03/2019 12:00 AM Negative  Negative   11/17/2018 12:00 AM Negative  Negative   01/04/2018 12:00 AM Negative  Negative   10/28/2016 12:00 AM Negative  Negative   02/11/2016 12:00 AM Negative    *Negative    *Negative  Negative    *Negative    *Negative   06/25/2015 12:00 AM Negative    * Negative    * Negative  Negative    * Negative    * Negative     Hepatitis B Lab Results  Component Value Date   HEPBSAB NON-REACTIVE 08/26/2022   HEPBSAG NEG 02/12/2010   HEPBCAB NEG 02/12/2010   Hepatitis C No results found for: HEPCAB, HCVRNAPCRQN Hepatitis A Lab Results  Component Value Date   HAV REACTIVE (A)  08/26/2022   Lipids: Lab Results  Component Value Date   CHOL 125 12/30/2022   TRIG 59 12/30/2022   HDL 44 12/30/2022   CHOLHDL 2.8 12/30/2022   VLDL 38 (H) 06/25/2015   LDLCALC 67 12/30/2022    TARGET DATE: 13th day of the month  Assessment: *** presents today for *** maintenance Cabenuva  injections. Past injections were tolerated well without issues. Last follow up visit and Cabenuva  administration on 02/25/23. Last provider visit 12/30/22 with Dr. Efrain. He will be due to see Dr. Efrain again in March. Last viral load was <20 on 12/30/22. On last visit, STI testing (oral/rectal/urine cytologies) and RPR were obtained per patient request, all returning negative.  Patients experiences pain in legs for 3-5 days after the shots. Premedicating with ibuprofen /advil  30 minutes prior to appointment. We've adjusted administration from standing to prone on exam table on last visit.   cBMP 3/24 except ALT 51 Immunizations: due for COVID 19  Administered cabotegravir  600mg /35mL in left upper outer quadrant of the gluteal muscle. Administered rilpivirine  900 mg/3mL in the right upper outer quadrant of the gluteal muscle. No issues with injections. *** will follow up in 2 months for next set of injections.  Plan: - Cabenuva  injections administered - Defer viral load on next provider visit with Dr. Efrain on 06/2022 - Next injections scheduled for *** - Call with any issues or questions  Cassie L. Kuppelweiser, PharmD, BCIDP, AAHIVP, CPP Clinical Pharmacist Practitioner Infectious Diseases Clinical Pharmacist [facility]

## 2023-05-04 ENCOUNTER — Other Ambulatory Visit (HOSPITAL_COMMUNITY): Payer: Self-pay

## 2023-05-06 ENCOUNTER — Other Ambulatory Visit (HOSPITAL_COMMUNITY): Payer: Self-pay

## 2023-05-07 ENCOUNTER — Other Ambulatory Visit (HOSPITAL_COMMUNITY): Payer: Self-pay

## 2023-05-25 ENCOUNTER — Other Ambulatory Visit: Payer: Self-pay

## 2023-05-25 ENCOUNTER — Other Ambulatory Visit (HOSPITAL_COMMUNITY): Payer: Self-pay

## 2023-05-25 ENCOUNTER — Telehealth: Payer: Self-pay | Admitting: Pharmacist

## 2023-05-25 DIAGNOSIS — B20 Human immunodeficiency virus [HIV] disease: Secondary | ICD-10-CM

## 2023-05-25 MED ORDER — CABOTEGRAVIR & RILPIVIRINE ER 600 & 900 MG/3ML IM SUER
1.0000 | INTRAMUSCULAR | 1 refills | Status: DC
Start: 2023-05-25 — End: 2023-08-06
  Filled 2023-05-25: qty 6, 30d supply, fill #0
  Filled 2023-06-10: qty 6, 30d supply, fill #1

## 2023-05-25 MED ORDER — CABOTEGRAVIR & RILPIVIRINE ER 600 & 900 MG/3ML IM SUER: 1.0000 | INTRAMUSCULAR | 5 refills | Status: DC

## 2023-05-25 NOTE — Addendum Note (Signed)
Addended by: Jennette Kettle on: 05/25/2023 04:42 PM   Modules accepted: Orders

## 2023-05-25 NOTE — Telephone Encounter (Signed)
Patient missed 1/9 [facility] appointment and is now rescheduled with Dr. Luciana Axe tomorrow for next injection. Last injection given on 02/25/23. Since it has been < 3 months since his last injection, he can receive tomorrow's injection and proceed with his normal dosing schedule. This will mean he can keep his appointment for 3/7 with Dr. Luciana Axe for his next [place] injection. If someone from the team tomorrow can make sure he is scheduled for his May injections, that would be greatly appreciated!   Margarite Gouge, PharmD, CPP, BCIDP, AAHIVP Clinical Pharmacist Practitioner Infectious Diseases Clinical Pharmacist [facility]

## 2023-05-25 NOTE — Progress Notes (Signed)
Script was wrong Marchelle Folks will send in new script

## 2023-05-25 NOTE — Addendum Note (Signed)
Addended by: Jennette Kettle on: 05/25/2023 04:16 PM   Modules accepted: Orders

## 2023-05-25 NOTE — Progress Notes (Signed)
Specialty Pharmacy Initial Fill Coordination Note  Trea Latner is a 38 y.o. male contacted today regarding initial fill of specialty medication(s) Cabotegravir & Rilpivirine (CABENUVA)   Patient requested Courier to Provider Office   Delivery date: 05/28/23   Verified address: [address] [place] [address]   Medication will be filled on 05/27/23.   Patient is aware of 0.00 copayment.

## 2023-05-25 NOTE — Addendum Note (Signed)
Addended by: Jennette Kettle on: 05/25/2023 04:54 PM   Modules accepted: Orders

## 2023-05-26 ENCOUNTER — Other Ambulatory Visit (HOSPITAL_COMMUNITY): Payer: Self-pay

## 2023-05-26 ENCOUNTER — Other Ambulatory Visit (HOSPITAL_COMMUNITY)
Admission: RE | Admit: 2023-05-26 | Discharge: 2023-05-26 | Disposition: A | Discharge status: Home / Self Care | Payer: Medicaid Other | Source: Ambulatory Visit | Attending: Internal Medicine | Admitting: Internal Medicine

## 2023-05-26 ENCOUNTER — Ambulatory Visit: Payer: Medicaid Other | Admitting: Internal Medicine

## 2023-05-26 ENCOUNTER — Other Ambulatory Visit: Payer: Self-pay

## 2023-05-26 VITALS — Wt 197.0 lb

## 2023-05-26 DIAGNOSIS — Z113 Encounter for screening for infections with a predominantly sexual mode of transmission: Secondary | ICD-10-CM

## 2023-05-26 DIAGNOSIS — B20 Human immunodeficiency virus [HIV] disease: Secondary | ICD-10-CM

## 2023-05-26 MED ORDER — CABOTEGRAVIR & RILPIVIRINE ER 600 & 900 MG/3ML IM SUER
1.0000 | Freq: Once | INTRAMUSCULAR | Status: AC
  Administered 2023-05-26: 1 via INTRAMUSCULAR

## 2023-05-27 ENCOUNTER — Encounter: Payer: Self-pay | Admitting: Internal Medicine

## 2023-05-27 ENCOUNTER — Other Ambulatory Visit: Payer: Self-pay

## 2023-05-27 LAB — CYTOLOGY, (ORAL, ANAL, URETHRAL) ANCILLARY ONLY
Chlamydia: NEGATIVE
Chlamydia: NEGATIVE
Comment: NEGATIVE
Comment: NEGATIVE
Comment: NORMAL
Comment: NORMAL
Neisseria Gonorrhea: NEGATIVE
Neisseria Gonorrhea: NEGATIVE

## 2023-05-27 LAB — URINE CYTOLOGY ANCILLARY ONLY
Chlamydia: NEGATIVE
Comment: NEGATIVE
Comment: NORMAL
Neisseria Gonorrhea: NEGATIVE

## 2023-05-27 LAB — RPR: RPR Ser Ql: NONREACTIVE

## 2023-05-27 NOTE — Progress Notes (Signed)
   Subjective:    Patient ID: Eduardo Leon, male    DOB: February 06, 1986, 38 y.o.   MRN: [id number]  HPI Eduardo Leon is here for follow-up of HIV. Due to some insurance issues, he has missed his previous Cabenuva  dosing and here to restart.  He has resolved his insurance issues he believes and now it is covered.  He has no other complaints today.   Review of Systems  Constitutional:  Negative for fatigue.  Gastrointestinal:  Negative for diarrhea.  Skin:  Negative for rash.       Objective:   Physical Exam Eyes:     General: No scleral icterus. Pulmonary:     Effort: Pulmonary effort is normal.  Neurological:     Mental Status: He is alert.   SH; no tobacco        Assessment & Plan:

## 2023-05-27 NOTE — Assessment & Plan Note (Signed)
 Will get him back on track with Cabenuva  with a dose today and then he has follow-up on March 7 with me for another dose.  Then we will get him back on 23-month dosing.  No concerns otherwise.  Will check labs at his next visit.  I have personally spent 31 minutes involved in face-to-face and non-face-to-face activities for this patient on the day of the visit. Professional time spent includes the following activities: Preparing to see the patient (review of tests), Obtaining and/or reviewing separately obtained history (admission/discharge record), Performing a medically appropriate examination and/or evaluation , Ordering medications/tests/procedures, referring and communicating with other health care professionals, giving injections, Documenting clinical information in the EMR, Independently interpreting results (not separately reported), Communicating results to the patient/family/caregiver, Counseling and educating the patient/family/caregiver and Care coordination (not separately reported).

## 2023-05-28 ENCOUNTER — Telehealth: Payer: Self-pay

## 2023-05-28 NOTE — Telephone Encounter (Signed)
 [facility] Patient Advocate Encounter  Patient's medications CABENUVA have been couriered to [facility] from [facility] pharmacy and will be administered at the patients appointment on 05/26/23.  Kae Heller, CPhT Specialty Pharmacy Patient [facility] for Infectious Disease Phone: [phone] Fax:  [phone]

## 2023-06-08 ENCOUNTER — Other Ambulatory Visit: Payer: Medicaid Other

## 2023-06-10 ENCOUNTER — Other Ambulatory Visit: Payer: Self-pay

## 2023-06-10 ENCOUNTER — Other Ambulatory Visit (HOSPITAL_COMMUNITY): Payer: Self-pay

## 2023-06-10 NOTE — Progress Notes (Signed)
 Specialty Pharmacy Refill Coordination Note  Tee Richeson is a 38 y.o. male assessed today regarding refills of clinic administered specialty medication(s) Cabotegravir & Rilpivirine [facility])   Clinic requested Courier to Provider Office   Delivery date: 06/23/23   Verified address: [address] [place] [address]   Medication will be filled on 06/22/23.

## 2023-06-22 ENCOUNTER — Other Ambulatory Visit: Payer: Self-pay

## 2023-06-22 ENCOUNTER — Other Ambulatory Visit (HOSPITAL_COMMUNITY): Payer: Self-pay

## 2023-06-23 ENCOUNTER — Other Ambulatory Visit: Payer: Self-pay

## 2023-06-24 ENCOUNTER — Other Ambulatory Visit: Payer: Self-pay

## 2023-06-25 ENCOUNTER — Telehealth: Payer: Self-pay | Admitting: Pharmacist

## 2023-06-25 ENCOUNTER — Other Ambulatory Visit: Payer: Self-pay

## 2023-06-25 ENCOUNTER — Other Ambulatory Visit (HOSPITAL_COMMUNITY): Payer: Self-pay

## 2023-06-25 NOTE — Telephone Encounter (Signed)
 Lupita Leash reached out to patient last week stating he needed to call Medicaid and let them know his other insurance has been cancelled. We are unable to administer Cabenuva until he calls Medicaid as they will not cover the injections otherwise. His appointment for tomorrow will need to be rescheduled until this is resolved. Lupita Leash has called/left voicemail and sent MyChart message; will await his response.  Although he was late to receive his "January" injection in February, he was still right within 30 days of his January target date of the 13th, so we can keep his same target date. In this case, he has until 3/20 to reschedule.   Margarite Gouge, PharmD, CPP, BCIDP, AAHIVP Clinical Pharmacist Practitioner Infectious Diseases Clinical Pharmacist [facility]

## 2023-06-25 NOTE — Progress Notes (Signed)
 Patient's community based Medicaid has changed from [facility] to [facility], and new coverage is rejecting for other coverage. Patient was provided with [facility] phone [phone]) to call and have [employer] removed so that we may process.

## 2023-06-26 ENCOUNTER — Other Ambulatory Visit (HOSPITAL_COMMUNITY): Payer: Self-pay

## 2023-06-26 ENCOUNTER — Other Ambulatory Visit: Payer: Self-pay

## 2023-06-26 ENCOUNTER — Encounter: Payer: BLUE CROSS/BLUE SHIELD | Admitting: Internal Medicine

## 2023-06-29 ENCOUNTER — Other Ambulatory Visit: Payer: Self-pay

## 2023-06-29 ENCOUNTER — Other Ambulatory Visit (HOSPITAL_COMMUNITY): Payer: Self-pay

## 2023-06-30 ENCOUNTER — Other Ambulatory Visit: Payer: Self-pay

## 2023-07-01 ENCOUNTER — Other Ambulatory Visit (HOSPITAL_COMMUNITY): Payer: Self-pay

## 2023-07-01 ENCOUNTER — Telehealth: Payer: Self-pay

## 2023-07-01 NOTE — Telephone Encounter (Signed)
 [facility] Patient Advocate Encounter  Patient's medications CABENUVA have been couriered to [facility] from [facility] and will be administered at the patients appointment on 07/09/23.  Kae Heller, CPhT Specialty Pharmacy Patient [facility] for Infectious Disease Phone: [phone] Fax:  [phone]

## 2023-07-06 NOTE — Progress Notes (Signed)
 HPI: Eduardo Leon is a 38 y.o. male who presents to the [facility] pharmacy clinic for Dovray administration.  Patient Active Problem List   Diagnosis Date Noted   Hyperglycemia 03/14/2020   Need for prophylactic vaccination and inoculation against influenza 03/21/2019   Medication monitoring encounter 01/25/2018   Screening examination for venereal disease 06/25/2015   Encounter for long-term (current) use of medications 06/25/2015   Syphilis in male 01/18/2014   Hemorrhoids, internal, with bleeding 08/02/2013   Depression    Human immunodeficiency virus (HIV) disease (HCC) 02/12/2010    Patient's Medications  New Prescriptions   No medications on file  Previous Medications   CABOTEGRAVIR & RILPIVIRINE ER (CABENUVA) 600 & 900 MG/3ML INJECTION    Inject 1 kit into the muscle every 30 (thirty) days.   EPINEPHRINE (EPIPEN 2-PAK) 0.3 MG/0.3 ML IJ SOAJ INJECTION    Inject 0.3 mg into the muscle as needed for anaphylaxis.   IBUPROFEN (ADVIL) 800 MG TABLET    Take 1 tablet (800 mg total) by mouth every 8 (eight) hours as needed.  Modified Medications   No medications on file  Discontinued Medications   No medications on file    Allergies: Allergies  Allergen Reactions   Shellfish-Derived Products Anaphylaxis, Hives and Itching   Codeine Hives   Hydrocodone     Severe itching and rash on legs and back. 2mg  morphine charted in 2011 - reaction unknown   Vancomycin Hives    Past Medical History: Past Medical History:  Diagnosis Date   Anxiety    Depression    After HIV Diagnosis, h/o SI; has been on Cymbalta with adverse rxns   Fibromyalgia    HIV (human immunodeficiency virus infection) (HCC) Aug 2012   Contracted sexually   Migraines    Mgmt with OTC meds    Social History: Social History   Socioeconomic History   Marital status: Single    Spouse name: Not on file   Number of children: Not on file   Years of education: Not on file   Highest education level: Not on  file  Occupational History   Not on file  Tobacco Use   Smoking status: Never   Smokeless tobacco: Never  Substance and Sexual Activity   Alcohol use: No    Alcohol/week: 0.0 standard drinks of alcohol   Drug use: No   Sexual activity: Yes    Partners: Male    Birth control/protection: Condom    Comment: accepted condoms  Other Topics Concern   Not on file  Social History Narrative   Currently unemployed. has attended [employer] (but not completed).  Could not finish school due to migraines. Lives alone   Social Drivers of Health   Financial Resource Strain: Not on file  Food Insecurity: Not on file  Transportation Needs: Not on file  Physical Activity: Not on file  Stress: Not on file  Social Connections: Not on file    Labs: Lab Results  Component Value Date   HIV1RNAQUANT Not Detected 12/30/2022   HIV1RNAQUANT Not Detected 10/29/2022   HIV1RNAQUANT Not Detected 08/26/2022   CD4TABS 796 12/30/2022   CD4TABS 643 07/01/2022   CD4TABS 644 03/18/2022    RPR and STI Lab Results  Component Value Date   LABRPR NON-REACTIVE 05/26/2023   LABRPR NON-REACTIVE 02/25/2023   LABRPR NON-REACTIVE 12/30/2022   LABRPR NON-REACTIVE 10/29/2022   LABRPR NON-REACTIVE 08/26/2022   RPRTITER 1:1 (H) 03/18/2021   RPRTITER 1:1 (H) 02/28/2020   RPRTITER 1:1 (  H) 03/03/2019   RPRTITER 1:2 (H) 01/04/2018   RPRTITER 1:64 (A) 10/28/2016    STI Results GC CT  05/26/2023  1:51 PM Negative    Negative    Negative  Negative    Negative    Negative   02/25/2023 11:01 AM Negative    Negative    Negative  Negative    Negative    Negative   12/30/2022 10:54 AM Negative    Negative    Negative  Negative    Negative    Negative   10/29/2022 11:11 AM Negative    Negative    Negative  Negative    Negative    Negative   08/26/2022 11:38 AM Negative    Negative    Negative  Negative    Negative    Negative   07/01/2022 10:06 AM Negative    Negative    Negative  Negative     Negative    Negative   04/30/2022 10:41 AM Negative    Negative    Negative  Negative    Negative    Negative   03/18/2022 10:23 AM Negative    Negative    Positive  Negative    Negative    Negative   03/18/2021  4:20 PM Negative    Negative    Positive  Negative    Negative    Negative   02/18/2021  5:44 PM Negative  Negative   06/05/2020  7:45 PM Negative  Positive   03/14/2020 10:54 AM Negative    Negative  Negative    Negative   02/28/2020 10:45 AM Negative  Negative   01/31/2020  7:01 PM Negative  Negative   03/21/2019  9:33 AM Negative    Negative  Negative    Negative   03/03/2019 12:00 AM Negative  Negative   11/17/2018 12:00 AM Negative  Negative   01/04/2018 12:00 AM Negative  Negative   10/28/2016 12:00 AM Negative  Negative   02/11/2016 12:00 AM Negative    *Negative    *Negative  Negative    *Negative    *Negative     Hepatitis B Lab Results  Component Value Date   HEPBSAB NON-REACTIVE 08/26/2022   HEPBSAG NEG 02/12/2010   HEPBCAB NEG 02/12/2010   Hepatitis C No results found for: "HEPCAB", "HCVRNAPCRQN" Hepatitis A Lab Results  Component Value Date   HAV REACTIVE (A) 08/26/2022   Lipids: Lab Results  Component Value Date   CHOL 125 12/30/2022   TRIG 59 12/30/2022   HDL 44 12/30/2022   CHOLHDL 2.8 12/30/2022   VLDL 38 (H) 06/25/2015   LDLCALC 67 12/30/2022    TARGET DATE:  The 13th of the month  Assessment: Eduardo Leon presents today for their maintenance Cabenuva injections. Initial/past injections were tolerated well without issues. No problems with systemic effects of injections. Patient missed January injection window but did receive injection 05/26/23 which was within 30 days from missed target date of 05/04/23; continuing with normal dosing schedule today with next dose due ~09/01/23. Patient's insurance issues have also been resolved.   Administered cabotegravir 600mg /66mL in left upper outer quadrant of the gluteal muscle.  Administered rilpivirine 900 mg/24mL in the right upper outer quadrant of the gluteal muscle. Monitored patient for 10 minutes after injection. Injections were tolerated well without issue. Patient will follow up in 2 months for next injection. Will check HIV RNA today along with routine CMP and CBC.  Will check RPR and urine/oral/rectal cytologies today; denies  any new partners or STI concerns.    Due for HBV and COVID vaccines which he declines.   Plan: - Cabenuva injections administered - Check HIV RNA, CBC, CMP, RPR, and urine/oral/rectal cytologies - Next injections scheduled for 5/7 with me, 7/8 with Dr. Luciana Axe, and 9/9 with me  - Call with any issues or questions  Margarite Gouge, PharmD, CPP, BCIDP, AAHIVP Clinical Pharmacist Practitioner Infectious Diseases Clinical Pharmacist [facility]

## 2023-07-09 ENCOUNTER — Other Ambulatory Visit (HOSPITAL_COMMUNITY)
Admission: RE | Admit: 2023-07-09 | Discharge: 2023-07-09 | Disposition: A | Discharge status: Home / Self Care | Source: Ambulatory Visit | Attending: Infectious Disease | Admitting: Infectious Disease

## 2023-07-09 ENCOUNTER — Other Ambulatory Visit: Payer: Self-pay

## 2023-07-09 ENCOUNTER — Ambulatory Visit: Admitting: Pharmacist

## 2023-07-09 DIAGNOSIS — B20 Human immunodeficiency virus [HIV] disease: Secondary | ICD-10-CM

## 2023-07-09 DIAGNOSIS — Z113 Encounter for screening for infections with a predominantly sexual mode of transmission: Secondary | ICD-10-CM | POA: Insufficient documentation

## 2023-07-09 MED ORDER — CABOTEGRAVIR & RILPIVIRINE ER 600 & 900 MG/3ML IM SUER
1.0000 | Freq: Once | INTRAMUSCULAR | Status: AC
  Administered 2023-07-09: 1 via INTRAMUSCULAR

## 2023-07-10 LAB — CYTOLOGY, (ORAL, ANAL, URETHRAL) ANCILLARY ONLY
Chlamydia: NEGATIVE
Chlamydia: NEGATIVE
Comment: NEGATIVE
Comment: NEGATIVE
Comment: NORMAL
Comment: NORMAL
Neisseria Gonorrhea: NEGATIVE
Neisseria Gonorrhea: NEGATIVE

## 2023-07-10 LAB — URINE CYTOLOGY ANCILLARY ONLY
Chlamydia: NEGATIVE
Comment: NEGATIVE
Comment: NORMAL
Neisseria Gonorrhea: NEGATIVE

## 2023-07-11 LAB — HIV-1 RNA QUANT-NO REFLEX-BLD
HIV 1 RNA Quant: NOT DETECTED {copies}/mL
HIV-1 RNA Quant, Log: NOT DETECTED {Log_copies}/mL

## 2023-07-11 LAB — COMPREHENSIVE METABOLIC PANEL
AG Ratio: 1.6 (calc) (ref 1.0–2.5)
ALT: 48 U/L — ABNORMAL HIGH (ref 9–46)
AST: 26 U/L (ref 10–40)
Albumin: 4.7 g/dL (ref 3.6–5.1)
Alkaline phosphatase (APISO): 91 U/L (ref 36–130)
BUN: 12 mg/dL (ref 7–25)
CO2: 26 mmol/L (ref 20–32)
Calcium: 9.7 mg/dL (ref 8.6–10.3)
Chloride: 104 mmol/L (ref 98–110)
Creat: 1.03 mg/dL (ref 0.60–1.26)
Globulin: 3 g/dL (ref 1.9–3.7)
Glucose, Bld: 153 mg/dL — ABNORMAL HIGH (ref 65–99)
Potassium: 4.2 mmol/L (ref 3.5–5.3)
Sodium: 139 mmol/L (ref 135–146)
Total Bilirubin: 0.4 mg/dL (ref 0.2–1.2)
Total Protein: 7.7 g/dL (ref 6.1–8.1)
eGFR: 95 mL/min/{1.73_m2} (ref >=60)

## 2023-07-11 LAB — CBC
HCT: 49.9 % (ref 38.5–50.0)
Hemoglobin: 17 g/dL (ref 13.2–17.1)
MCH: 30.5 pg (ref 27.0–33.0)
MCHC: 34.1 g/dL (ref 32.0–36.0)
MCV: 89.4 fL (ref 80.0–100.0)
MPV: 8.9 fL (ref 7.5–12.5)
Platelets: 335 10*3/uL (ref 140–400)
RBC: 5.58 10*6/uL (ref 4.20–5.80)
RDW: 12.8 % (ref 11.0–15.0)
WBC: 5.3 10*3/uL (ref 3.8–10.8)

## 2023-07-11 LAB — RPR: RPR Ser Ql: NONREACTIVE

## 2023-07-16 ENCOUNTER — Encounter: Admitting: Internal Medicine

## 2023-08-06 ENCOUNTER — Other Ambulatory Visit: Payer: Self-pay | Admitting: Pharmacist

## 2023-08-06 ENCOUNTER — Other Ambulatory Visit: Payer: Self-pay

## 2023-08-06 ENCOUNTER — Other Ambulatory Visit (HOSPITAL_COMMUNITY): Payer: Self-pay

## 2023-08-06 DIAGNOSIS — B20 Human immunodeficiency virus [HIV] disease: Secondary | ICD-10-CM

## 2023-08-06 MED ORDER — CABOTEGRAVIR & RILPIVIRINE ER 600 & 900 MG/3ML IM SUER
1.0000 | INTRAMUSCULAR | 5 refills | Status: DC
  Filled 2023-08-06: qty 6, 34d supply, fill #0

## 2023-08-06 NOTE — Progress Notes (Signed)
 Specialty Pharmacy Refill Coordination Note  Eduardo Leon is a 39 y.o. male assessed today regarding refills of clinic administered specialty medication(s) Cabotegravir & Rilpivirine (CABENUVA)   Clinic requested Courier to Provider Office   Delivery date: 08/17/23   Verified address: [address] [place] [address]   Medication will be filled on 08/14/23.

## 2023-08-14 ENCOUNTER — Other Ambulatory Visit (HOSPITAL_COMMUNITY): Payer: Self-pay

## 2023-08-14 ENCOUNTER — Telehealth: Payer: Self-pay

## 2023-08-14 ENCOUNTER — Other Ambulatory Visit: Payer: Self-pay

## 2023-08-14 ENCOUNTER — Other Ambulatory Visit: Payer: Self-pay | Admitting: Pharmacist

## 2023-08-14 DIAGNOSIS — B20 Human immunodeficiency virus [HIV] disease: Secondary | ICD-10-CM

## 2023-08-14 MED ORDER — CABOTEGRAVIR & RILPIVIRINE ER 600 & 900 MG/3ML IM SUER
1.0000 | INTRAMUSCULAR | 5 refills | Status: AC
  Filled 2023-08-14: qty 6, 34d supply, fill #0
  Filled 2023-10-20: qty 6, 34d supply, fill #1
  Filled 2023-12-17: qty 6, 34d supply, fill #2
  Filled 2024-02-10: qty 6, 34d supply, fill #3
  Filled 2024-03-30: qty 6, 34d supply, fill #4

## 2023-08-14 NOTE — Telephone Encounter (Signed)
 Pharmacy Patient Advocate Encounter   Received notification from CoverMyMeds that prior authorization for CABENUVA  is required/requested.   Insurance verification completed.   The patient is insured through [facility] .   Per test claim: PA required; PA submitted to above mentioned insurance via CoverMyMeds Key/confirmation #/EOC [facility] Status is pending   CLINICAL QUESTIONS ANSWERED

## 2023-08-17 ENCOUNTER — Other Ambulatory Visit (HOSPITAL_COMMUNITY): Payer: Self-pay

## 2023-08-18 ENCOUNTER — Other Ambulatory Visit (HOSPITAL_COMMUNITY): Payer: Self-pay

## 2023-08-18 ENCOUNTER — Other Ambulatory Visit: Payer: Self-pay

## 2023-08-19 ENCOUNTER — Other Ambulatory Visit (HOSPITAL_COMMUNITY): Payer: Self-pay

## 2023-08-19 ENCOUNTER — Telehealth: Payer: Self-pay

## 2023-08-19 NOTE — Telephone Encounter (Signed)
 [facility] Patient Advocate Encounter  Patient's medications CABENUVA  have been couriered to [facility] from [facility] and will be administered at the patients appointment on 08/26/23.  Verline Glow, CPhT Specialty Pharmacy Patient [facility] for Infectious Disease Phone: [phone] Fax:  [phone]

## 2023-08-25 NOTE — Progress Notes (Unsigned)
 HPI: Eduardo Leon is a 38 y.o. male who presents to the [facility] pharmacy clinic for Cabenuva  administration.  Patient Active Problem List   Diagnosis Date Noted   Hyperglycemia 03/14/2020   Need for prophylactic vaccination and inoculation against influenza 03/21/2019   Medication monitoring encounter 01/25/2018   Screening examination for venereal disease 06/25/2015   Encounter for long-term (current) use of medications 06/25/2015   Syphilis in male 01/18/2014   Hemorrhoids, internal, with bleeding 08/02/2013   Depression    Human immunodeficiency virus (HIV) disease (HCC) 02/12/2010    Patient's Medications  New Prescriptions   No medications on file  Previous Medications   CABOTEGRAVIR  & RILPIVIRINE  ER (CABENUVA ) 600 & 900 MG/3ML INJECTION    Inject 1 kit into the muscle every 2 (two) months.   EPINEPHRINE  (EPIPEN  2-PAK) 0.3 MG/0.3 ML IJ SOAJ INJECTION    Inject 0.3 mg into the muscle as needed for anaphylaxis.   IBUPROFEN  (ADVIL ) 800 MG TABLET    Take 1 tablet (800 mg total) by mouth every 8 (eight) hours as needed.  Modified Medications   No medications on file  Discontinued Medications   No medications on file    Allergies: Allergies  Allergen Reactions   Shellfish-Derived Products Anaphylaxis, Hives and Itching   Codeine Hives   Hydrocodone      Severe itching and rash on legs and back. 2mg  morphine  charted in 2011 - reaction unknown   Vancomycin  Hives    Labs: Lab Results  Component Value Date   HIV1RNAQUANT NOT DETECTED 07/09/2023   HIV1RNAQUANT Not Detected 12/30/2022   HIV1RNAQUANT Not Detected 10/29/2022   CD4TABS 796 12/30/2022   CD4TABS 643 07/01/2022   CD4TABS 644 03/18/2022    RPR and STI Lab Results  Component Value Date   LABRPR NON-REACTIVE 07/09/2023   LABRPR NON-REACTIVE 05/26/2023   LABRPR NON-REACTIVE 02/25/2023   LABRPR NON-REACTIVE 12/30/2022   LABRPR NON-REACTIVE 10/29/2022   RPRTITER 1:1 (H) 03/18/2021   RPRTITER 1:1 (H) 02/28/2020    RPRTITER 1:1 (H) 03/03/2019   RPRTITER 1:2 (H) 01/04/2018   RPRTITER 1:64 (A) 10/28/2016    STI Results GC CT  07/09/2023  9:56 AM Negative    Negative    Negative  Negative    Negative    Negative   05/26/2023  1:51 PM Negative    Negative    Negative  Negative    Negative    Negative   02/25/2023 11:01 AM Negative    Negative    Negative  Negative    Negative    Negative   12/30/2022 10:54 AM Negative    Negative    Negative  Negative    Negative    Negative   10/29/2022 11:11 AM Negative    Negative    Negative  Negative    Negative    Negative   08/26/2022 11:38 AM Negative    Negative    Negative  Negative    Negative    Negative   07/01/2022 10:06 AM Negative    Negative    Negative  Negative    Negative    Negative   04/30/2022 10:41 AM Negative    Negative    Negative  Negative    Negative    Negative   03/18/2022 10:23 AM Negative    Negative    Positive  Negative    Negative    Negative   03/18/2021  4:20 PM Negative    Negative    Positive  Negative    Negative    Negative   02/18/2021  5:44 PM Negative  Negative   06/05/2020  7:45 PM Negative  Positive   03/14/2020 10:54 AM Negative    Negative  Negative    Negative   02/28/2020 10:45 AM Negative  Negative   01/31/2020  7:01 PM Negative  Negative   03/21/2019  9:33 AM Negative    Negative  Negative    Negative   03/03/2019 12:00 AM Negative  Negative   11/17/2018 12:00 AM Negative  Negative   01/04/2018 12:00 AM Negative  Negative   10/28/2016 12:00 AM Negative  Negative     Hepatitis B Lab Results  Component Value Date   HEPBSAB NON-REACTIVE 08/26/2022   HEPBSAG NEG 02/12/2010   HEPBCAB NEG 02/12/2010   Hepatitis C No results found for: "HEPCAB", "HCVRNAPCRQN" Hepatitis A Lab Results  Component Value Date   HAV REACTIVE (A) 08/26/2022   Lipids: Lab Results  Component Value Date   CHOL 125 12/30/2022   TRIG 59 12/30/2022   HDL 44 12/30/2022   CHOLHDL 2.8  12/30/2022   VLDL 38 (H) 06/25/2015   LDLCALC 67 12/30/2022    TARGET DATE: 13th of each month  Assessment: Eduardo Leon presents today for his maintenance Cabenuva  injections. Past injections were tolerated well without issues. Last HIV RNA was undetectable in March 2025. Doing well with no issues today.  Administered cabotegravir  600mg /12mL in left upper outer quadrant of the gluteal muscle. Administered rilpivirine  900 mg/3mL in the right upper outer quadrant of the gluteal muscle. No issues with injections. He will follow up in 2 months for next set of injections.  Discussed immunizations today. Recommended Prevnar 20 and hepatitis B series. Amenable to Prevnar 20 today, would like to defer hepatitis B vaccine until next visit.  Plan: - Cabenuva  injections administered - Next appointments scheduled for 7/14 with Dr. Gillian Lacrosse and 9/9 with Mylinda Asa - Prevnar 20 vaccine administered - Call with any issues or questions   Valarie Garner, PharmD PGY1 Pharmacy Resident 08/25/2023, 10:41 PM

## 2023-08-26 ENCOUNTER — Other Ambulatory Visit: Payer: Self-pay

## 2023-08-26 ENCOUNTER — Ambulatory Visit (INDEPENDENT_AMBULATORY_CARE_PROVIDER_SITE_OTHER): Admitting: Pharmacist

## 2023-08-26 ENCOUNTER — Other Ambulatory Visit (HOSPITAL_COMMUNITY)
Admission: RE | Admit: 2023-08-26 | Discharge: 2023-08-26 | Disposition: A | Discharge status: Home / Self Care | Source: Ambulatory Visit | Attending: Infectious Disease | Admitting: Infectious Disease

## 2023-08-26 DIAGNOSIS — B20 Human immunodeficiency virus [HIV] disease: Secondary | ICD-10-CM | POA: Diagnosis present

## 2023-08-26 DIAGNOSIS — Z113 Encounter for screening for infections with a predominantly sexual mode of transmission: Secondary | ICD-10-CM

## 2023-08-26 DIAGNOSIS — Z23 Encounter for immunization: Secondary | ICD-10-CM | POA: Diagnosis not present

## 2023-08-26 MED ORDER — CABOTEGRAVIR & RILPIVIRINE ER 600 & 900 MG/3ML IM SUER
1.0000 | Freq: Once | INTRAMUSCULAR | Status: AC
  Administered 2023-08-26: 1 via INTRAMUSCULAR

## 2023-08-27 LAB — CYTOLOGY, (ORAL, ANAL, URETHRAL) ANCILLARY ONLY
Chlamydia: NEGATIVE
Chlamydia: NEGATIVE
Comment: NEGATIVE
Comment: NEGATIVE
Comment: NORMAL
Comment: NORMAL
Neisseria Gonorrhea: NEGATIVE
Neisseria Gonorrhea: NEGATIVE

## 2023-08-27 LAB — URINE CYTOLOGY ANCILLARY ONLY
Chlamydia: NEGATIVE
Comment: NEGATIVE
Comment: NORMAL
Neisseria Gonorrhea: NEGATIVE

## 2023-08-28 LAB — RPR: RPR Ser Ql: NONREACTIVE

## 2023-10-20 ENCOUNTER — Other Ambulatory Visit (HOSPITAL_COMMUNITY): Payer: Self-pay

## 2023-10-20 ENCOUNTER — Other Ambulatory Visit: Payer: Self-pay

## 2023-10-20 NOTE — Progress Notes (Signed)
 Specialty Pharmacy Refill Coordination Note  Eduardo Leon is a 38 y.o. male assessed today regarding refills of clinic administered specialty medication(s) Cabotegravir  & Rilpivirine  (CABENUVA )   Clinic requested Courier to Provider Office   Delivery date: 10/26/23   Verified address: [address] [place] [address]   Medication will be filled on 10/22/23.

## 2023-10-22 ENCOUNTER — Encounter: Admitting: Infectious Diseases

## 2023-10-22 ENCOUNTER — Other Ambulatory Visit: Payer: Self-pay

## 2023-10-26 ENCOUNTER — Telehealth: Payer: Self-pay

## 2023-10-26 NOTE — Telephone Encounter (Signed)
 [facility] Patient Advocate Encounter  Patient's medications Cabenuva  have been couriered to [facility] from [facility] and will be administered at the patients appointment on 11/02/23.  Arland Hutchinson, CPhT Specialty Pharmacy Patient Advocate [facility] for Infectious Disease Phone: [phone] Fax:  [phone]

## 2023-10-27 ENCOUNTER — Encounter: Payer: Self-pay | Admitting: Internal Medicine

## 2023-11-02 ENCOUNTER — Other Ambulatory Visit (HOSPITAL_COMMUNITY)
Admission: RE | Admit: 2023-11-02 | Discharge: 2023-11-02 | Disposition: A | Discharge status: Home / Self Care | Source: Ambulatory Visit | Attending: Infectious Diseases | Admitting: Infectious Diseases

## 2023-11-02 ENCOUNTER — Ambulatory Visit (INDEPENDENT_AMBULATORY_CARE_PROVIDER_SITE_OTHER): Admitting: Infectious Diseases

## 2023-11-02 ENCOUNTER — Other Ambulatory Visit: Payer: Self-pay

## 2023-11-02 ENCOUNTER — Encounter: Payer: Self-pay | Admitting: Infectious Diseases

## 2023-11-02 VITALS — BP 121/81 | HR 69 | Temp 98.2°F | Ht 67.0 in | Wt 187.0 lb

## 2023-11-02 DIAGNOSIS — Z113 Encounter for screening for infections with a predominantly sexual mode of transmission: Secondary | ICD-10-CM | POA: Insufficient documentation

## 2023-11-02 DIAGNOSIS — Z0000 Encounter for general adult medical examination without abnormal findings: Secondary | ICD-10-CM | POA: Diagnosis not present

## 2023-11-02 DIAGNOSIS — F329 Major depressive disorder, single episode, unspecified: Secondary | ICD-10-CM | POA: Diagnosis not present

## 2023-11-02 DIAGNOSIS — Z23 Encounter for immunization: Secondary | ICD-10-CM

## 2023-11-02 DIAGNOSIS — B20 Human immunodeficiency virus [HIV] disease: Secondary | ICD-10-CM

## 2023-11-02 DIAGNOSIS — Z79899 Other long term (current) drug therapy: Secondary | ICD-10-CM

## 2023-11-02 MED ORDER — CABOTEGRAVIR & RILPIVIRINE ER 600 & 900 MG/3ML IM SUER
1.0000 | Freq: Once | INTRAMUSCULAR | Status: AC
  Administered 2023-11-02: 1 via INTRAMUSCULAR

## 2023-11-02 NOTE — Progress Notes (Unsigned)
 [address], [place], [place], [address]                                                                  Phn. [phone]; Fax: [phone]                                                                             Date: 11/02/23  Reason for Visit: Routine HIV care.  HPI: Eduardo Leon is a 38 y.o.old male with a history of Migrane, Anxiety/Depression, h/o syphilis s/p tx, HIV who is here for cabenuva  injection. Patient previously followed by Dr Efrain.   7/14 Reports significant soreness and flu-like symptoms, including tremors and body aches, which subside after a few days of injections. He is sexually active with male partners. He consumes alcohol occasionally and denies smoking or recreational drug use. Works with intellectually disabled. Wants to get dental referral and needs to have wisdom teeth pulled. Agreed to start HBV vaccine series. No concerns with mental health. No other complaints.   ROS: As stated in above HPI; all other systems were reviewed and are otherwise negative unless noted below  No reported fever / chills, night sweats, unintentional weight loss, acute visual change, odynophagia, chest pain/pressure, new or worsened SOB or WOB, nausea, vomiting, diarrhea, dysuria, GU discharge, syncope, seizures, red/hot swollen joints, hallucinations / delusions, rashes, new allergies, unusual / excessive bleeding, swollen lymph nodes, or new hospitalizations/ED visits/Urgent Care visits since the pt was last seen.  PMH/ PSH/ FamHx / Social Hx , medications and allergies reviewed and updated as appropriate; please see corresponding tab in EHR / prior notes                                        Current Outpatient Medications on File Prior to Visit  Medication Sig Dispense Refill    cabotegravir  & rilpivirine  ER (CABENUVA ) 600 & 900 MG/3ML injection Inject 1 kit into the muscle every 2 (two) months. 6 mL 5   EPINEPHrine  (EPIPEN  2-PAK) 0.3 mg/0.3 mL IJ SOAJ injection Inject 0.3 mg into the muscle as needed for anaphylaxis. (Patient not taking: Reported on 03/18/2022) 1 each 0   ibuprofen  (ADVIL ) 800 MG tablet Take 1 tablet (800 mg total) by mouth every 8 (eight) hours as needed. 30 tablet 0   No current facility-administered medications on file prior to visit.    Allergies  Allergen Reactions   Shellfish-Derived Products Anaphylaxis, Hives and Itching   Codeine Hives   Hydrocodone      Severe itching and rash  on legs and back. 2mg  morphine  charted in 2011 - reaction unknown   Vancomycin  Hives   Past Medical History:  Diagnosis Date   Anxiety    Depression    After HIV Diagnosis, h/o SI; has been on Cymbalta  with adverse rxns   Fibromyalgia    HIV (human immunodeficiency virus infection) (HCC) Aug 2012   Contracted sexually   Migraines    Mgmt with OTC meds   Past Surgical History:  Procedure Laterality Date   IRRIGATION AND DEBRIDEMENT ABSCESS  09/10/2011   Procedure: IRRIGATION AND DEBRIDEMENT ABSCESS;  Surgeon: Donnice KATHEE Lunger, MD;  Location: [facility] OR;  Service: General;  Laterality: N/A;   Social History   Socioeconomic History   Marital status: Single    Spouse name: Not on file   Number of children: Not on file   Years of education: Not on file   Highest education level: Not on file  Occupational History   Not on file  Tobacco Use   Smoking status: Never   Smokeless tobacco: Never  Substance and Sexual Activity   Alcohol use: No    Alcohol/week: 0.0 standard drinks of alcohol   Drug use: No   Sexual activity: Yes    Partners: Male    Birth control/protection: Condom    Comment: accepted condoms  Other Topics Concern   Not on file  Social History Narrative   Currently unemployed. has attended [employer] (but not completed).  Could not  finish school due to migraines. Lives alone   Social Drivers of Health   Financial Resource Strain: Not on file  Food Insecurity: Not on file  Transportation Needs: Not on file  Physical Activity: Not on file  Stress: Not on file  Social Connections: Not on file  Intimate Partner Violence: Not on file   Family History  Problem Relation Age of Onset   Hypertension Father    Diabetes Father    Diabetes Other        Mother & Father sides of family   Diabetes Mother    Hypertension Mother    Family History  Problem Relation Age of Onset   Hypertension Father    Diabetes Father    Diabetes Other        Mother & Father sides of family   Diabetes Mother    Hypertension Mother    Vitals  BP 121/81   Pulse 69   Temp 98.2 F (36.8 C) (Temporal)   Ht 5' 7 (1.702 m)   Wt 187 lb (84.8 kg)   SpO2 96%   BMI 29.29 kg/m    Examination  Gen: no acute distress HEENT: /AT, no scleral icterus, no pale conjunctivae, hearing normal, oral mucosa moist Neck: Supple Cardio: Regular rate and rhythm, s1s2 Resp: Pulmonary effort normal in room air, Normal breath sounds  GI: non distended, soft GU: Musc: Extremities: No pedal edema Skin: No rashes Neuro: grossly non focal , awake, alert and oriented * 3  Psych: Calm, cooperative  Lab Results HIV 1 RNA Quant  Date Value  07/09/2023 NOT DETECTED copies/mL  12/30/2022 Not Detected Copies/mL  10/29/2022 Not Detected Copies/mL   CD4 T Cell Abs (/uL)  Date Value  12/30/2022 796  07/01/2022 643  03/18/2022 644   No results found for: HIV1GENOSEQ Lab Results  Component Value Date   WBC 5.3 07/09/2023   HGB 17.0 07/09/2023   HCT 49.9 07/09/2023   MCV 89.4 07/09/2023   PLT 335 07/09/2023  Lab Results  Component Value Date   CREATININE 1.03 07/09/2023   BUN 12 07/09/2023   NA 139 07/09/2023   K 4.2 07/09/2023   CL 104 07/09/2023   CO2 26 07/09/2023   Lab Results  Component Value Date   ALT 48 (H) 07/09/2023    AST 26 07/09/2023   ALKPHOS 84 03/15/2019   BILITOT 0.4 07/09/2023    Lab Results  Component Value Date   CHOL 125 12/30/2022   TRIG 59 12/30/2022   HDL 44 12/30/2022   LDLCALC 67 12/30/2022   Lab Results  Component Value Date   HAV REACTIVE (A) 08/26/2022   Lab Results  Component Value Date   HEPBSAG NEG 02/12/2010   HEPBSAB NON-REACTIVE 08/26/2022   Lab Results  Component Value Date   HCVAB NEG 02/12/2010   Lab Results  Component Value Date   CHLAMYDIAWP Negative 08/26/2023   N Negative 08/26/2023   No results found for: GCPROBEAPT No results found for: QUANTGOLD  Health Maintenance: Immunization History  Administered Date(s) Administered   HPV 9-valent 01/25/2018, 03/21/2019, 07/19/2019   Hepatitis A 04/16/2010   Hepatitis A, Adult 08/02/2013, 01/25/2014   Hepatitis B 04/16/2010   Hepatitis B, ADULT 08/02/2013, 09/01/2013, 01/25/2014   Influenza Split 01/27/2012   Influenza Whole 02/12/2010   Influenza, Seasonal, Injecte, Preservative Fre 02/25/2023   Influenza,inj,Quad PF,6+ Mos 01/18/2014, 06/25/2015, 02/11/2016, 01/25/2018, 03/21/2019, 03/14/2020   Meningococcal Mcv4o 03/21/2019, 07/19/2019   PNEUMOCOCCAL CONJUGATE-20 08/26/2023   PPD Test 02/12/2010   Pneumococcal Conjugate-13 01/25/2018   Pneumococcal Polysaccharide-23 02/12/2010, 08/31/2011   Tdap 06/12/2019    Assessment/Plan: # HIV - Cabenuva  injection today  - labs  - fu q2 schedule for cabenuva  as planned   # STDS screening  - urine, oral, anal GC + RPR  # Anxiety/Depression  - stable, denies SI/HI  # Immunization  - HBV #1 today and #2 in next visit   # Health maintenance - Lipid panel today - Dental referral completed  - discuss anal pap next visit   Patient's labs were reviewed as well as his previous records. Patients questions were addressed and answered. Safe sex counseling done.  I spent 30 minutes involved in face-to-face and non-face-to-face activities for this  patient on the day of the visit. Professional time spent includes the following activities: Preparing to see the patient (review of tests), Obtaining and reviewing separately obtained history (prior notes from Dr Efrain), Performing a medically appropriate examination and evaluation , Ordering medications/labs/vaccine,  Documenting clinical information in the EMR, Independently interpreting results (not separately reported), Communicating results to the patient, Counseling and educating the patient.  Of note, portions of this note may have been created with voice recognition software. While this note has been edited for accuracy, occasional wrong-word or 'sound-a-like' substitutions may have occurred due to the inherent limitations of voice recognition software.   Electronically signed by:  Annalee Orem, MD Infectious Disease Physician [facility] for Infectious Disease [address] [place], [place] [address] Phone: [phone]  Fax: [phone]

## 2023-11-03 LAB — URINE CYTOLOGY ANCILLARY ONLY
Chlamydia: NEGATIVE
Comment: NEGATIVE
Comment: NORMAL
Neisseria Gonorrhea: NEGATIVE

## 2023-11-03 LAB — CYTOLOGY, (ORAL, ANAL, URETHRAL) ANCILLARY ONLY
Chlamydia: NEGATIVE
Chlamydia: NEGATIVE
Comment: NEGATIVE
Comment: NEGATIVE
Comment: NORMAL
Comment: NORMAL
Neisseria Gonorrhea: NEGATIVE
Neisseria Gonorrhea: NEGATIVE

## 2023-11-03 LAB — T-HELPER CELLS (CD4) COUNT (NOT AT ARMC)
CD4 % Helper T Cell: 29 % — ABNORMAL LOW (ref 33–65)
CD4 T Cell Abs: 761 /uL (ref 400–1790)

## 2023-11-04 DIAGNOSIS — Z113 Encounter for screening for infections with a predominantly sexual mode of transmission: Secondary | ICD-10-CM | POA: Insufficient documentation

## 2023-11-04 DIAGNOSIS — B20 Human immunodeficiency virus [HIV] disease: Secondary | ICD-10-CM | POA: Insufficient documentation

## 2023-11-04 DIAGNOSIS — Z23 Encounter for immunization: Secondary | ICD-10-CM | POA: Insufficient documentation

## 2023-11-04 DIAGNOSIS — Z79899 Other long term (current) drug therapy: Secondary | ICD-10-CM | POA: Insufficient documentation

## 2023-11-04 DIAGNOSIS — Z0000 Encounter for general adult medical examination without abnormal findings: Secondary | ICD-10-CM | POA: Insufficient documentation

## 2023-11-05 ENCOUNTER — Ambulatory Visit: Payer: Self-pay | Admitting: Infectious Diseases

## 2023-11-05 LAB — COMPREHENSIVE METABOLIC PANEL WITH GFR
AG Ratio: 1.7 (calc) (ref 1.0–2.5)
ALT: 52 U/L — ABNORMAL HIGH (ref 9–46)
AST: 29 U/L (ref 10–40)
Albumin: 4.8 g/dL (ref 3.6–5.1)
Alkaline phosphatase (APISO): 96 U/L (ref 36–130)
BUN: 9 mg/dL (ref 7–25)
CO2: 28 mmol/L (ref 20–32)
Calcium: 9.8 mg/dL (ref 8.6–10.3)
Chloride: 103 mmol/L (ref 98–110)
Creat: 0.98 mg/dL (ref 0.60–1.26)
Globulin: 2.9 g/dL (ref 1.9–3.7)
Glucose, Bld: 135 mg/dL — ABNORMAL HIGH (ref 65–99)
Potassium: 4 mmol/L (ref 3.5–5.3)
Sodium: 139 mmol/L (ref 135–146)
Total Bilirubin: 0.7 mg/dL (ref 0.2–1.2)
Total Protein: 7.7 g/dL (ref 6.1–8.1)
eGFR: 101 mL/min/1.73m2 (ref >=60)

## 2023-11-05 LAB — LIPID PANEL
Cholesterol: 220 mg/dL — ABNORMAL HIGH (ref <200)
HDL: 41 mg/dL (ref >=40)
LDL Cholesterol (Calc): 158 mg/dL — ABNORMAL HIGH
Non-HDL Cholesterol (Calc): 179 mg/dL — ABNORMAL HIGH (ref <130)
Total CHOL/HDL Ratio: 5.4 (calc) — ABNORMAL HIGH (ref <5.0)
Triglycerides: 100 mg/dL (ref <150)

## 2023-11-05 LAB — HIV RNA, RTPCR W/R GT (RTI, PI,INT)
HIV 1 RNA Quant: NOT DETECTED {copies}/mL
HIV-1 RNA Quant, Log: NOT DETECTED {Log_copies}/mL

## 2023-11-05 LAB — RPR: RPR Ser Ql: NONREACTIVE

## 2023-12-17 ENCOUNTER — Other Ambulatory Visit: Payer: Self-pay

## 2023-12-17 ENCOUNTER — Other Ambulatory Visit (HOSPITAL_COMMUNITY): Payer: Self-pay

## 2023-12-17 NOTE — Progress Notes (Signed)
 Specialty Pharmacy Refill Coordination Note  Eduardo Leon is a 38 y.o. male assessed today regarding refills of clinic administered specialty medication(s) Cabotegravir  & Rilpivirine  (CABENUVA )   Clinic requested Courier to Provider Office   Delivery date: 12/24/23   Verified address: [address] [place] [address]   Medication will be filled on 12/23/23.

## 2023-12-23 ENCOUNTER — Other Ambulatory Visit: Payer: Self-pay

## 2023-12-24 ENCOUNTER — Telehealth: Payer: Self-pay

## 2023-12-24 NOTE — Telephone Encounter (Signed)
 [facility] Patient Advocate Encounter  Patient's medications CABENUVA  have been couriered to [facility] from [facility] and will be administered at the patients appointment on 12/29/23.  Charmaine Sharps, CPhT Specialty Pharmacy Patient [facility] for Infectious Disease Phone: [phone] Fax:  [phone]

## 2023-12-28 NOTE — Progress Notes (Unsigned)
 HPI: Eduardo Leon is a 38 y.o. male who presents to the [facility] pharmacy clinic for Cabenuva  administration.  Patient Active Problem List   Diagnosis Date Noted   HIV disease (HCC) 11/04/2023   Medication management 11/04/2023   Health care maintenance 11/04/2023   Screening for STDs (sexually transmitted diseases) 11/04/2023   Need for hepatitis B vaccination 11/04/2023   Hyperglycemia 03/14/2020   Need for prophylactic vaccination and inoculation against influenza 03/21/2019   Medication monitoring encounter 01/25/2018   Screening examination for venereal disease 06/25/2015   Encounter for long-term (current) use of medications 06/25/2015   Syphilis in male 01/18/2014   Hemorrhoids, internal, with bleeding 08/02/2013   Depression    Human immunodeficiency virus (HIV) disease (HCC) 02/12/2010    Patient's Medications  New Prescriptions   No medications on file  Previous Medications   CABOTEGRAVIR  & RILPIVIRINE  ER (CABENUVA ) 600 & 900 MG/3ML INJECTION    Inject 1 kit into the muscle every 2 (two) months.   EPINEPHRINE  (EPIPEN  2-PAK) 0.3 MG/0.3 ML IJ SOAJ INJECTION    Inject 0.3 mg into the muscle as needed for anaphylaxis.   IBUPROFEN  (ADVIL ) 800 MG TABLET    Take 1 tablet (800 mg total) by mouth every 8 (eight) hours as needed.  Modified Medications   No medications on file  Discontinued Medications   No medications on file    Allergies: Allergies  Allergen Reactions   Shellfish-Derived Products Anaphylaxis, Hives and Itching   Codeine Hives   Hydrocodone      Severe itching and rash on legs and back. 2mg  morphine  charted in 2011 - reaction unknown   Vancomycin  Hives    Labs: Lab Results  Component Value Date   HIV1RNAQUANT NOT DETECTED 11/02/2023   HIV1RNAQUANT NOT DETECTED 07/09/2023   HIV1RNAQUANT Not Detected 12/30/2022   CD4TABS 761 11/02/2023   CD4TABS 796 12/30/2022   CD4TABS 643 07/01/2022    RPR and STI Lab Results  Component Value Date   LABRPR  NON-REACTIVE 11/02/2023   LABRPR NON-REACTIVE 08/26/2023   LABRPR NON-REACTIVE 07/09/2023   LABRPR NON-REACTIVE 05/26/2023   LABRPR NON-REACTIVE 02/25/2023   RPRTITER 1:1 (H) 03/18/2021   RPRTITER 1:1 (H) 02/28/2020   RPRTITER 1:1 (H) 03/03/2019   RPRTITER 1:2 (H) 01/04/2018   RPRTITER 1:64 (A) 10/28/2016    STI Results GC CT  11/02/2023 11:00 AM Negative    Negative    Negative  Negative    Negative    Negative   08/26/2023 10:34 AM Negative  Negative   08/26/2023 10:32 AM Negative    Negative  Negative    Negative   07/09/2023  9:56 AM Negative    Negative    Negative  Negative    Negative    Negative   05/26/2023  1:51 PM Negative    Negative    Negative  Negative    Negative    Negative   02/25/2023 11:01 AM Negative    Negative    Negative  Negative    Negative    Negative   12/30/2022 10:54 AM Negative    Negative    Negative  Negative    Negative    Negative   10/29/2022 11:11 AM Negative    Negative    Negative  Negative    Negative    Negative   08/26/2022 11:38 AM Negative    Negative    Negative  Negative    Negative    Negative   07/01/2022 10:06 AM Negative  Negative    Negative  Negative    Negative    Negative   04/30/2022 10:41 AM Negative    Negative    Negative  Negative    Negative    Negative   03/18/2022 10:23 AM Negative    Negative    Positive  Negative    Negative    Negative   03/18/2021  4:20 PM Negative    Negative    Positive  Negative    Negative    Negative   02/18/2021  5:44 PM Negative  Negative   06/05/2020  7:45 PM Negative  Positive   03/14/2020 10:54 AM Negative    Negative  Negative    Negative   02/28/2020 10:45 AM Negative  Negative   01/31/2020  7:01 PM Negative  Negative   03/21/2019  9:33 AM Negative    Negative  Negative    Negative   03/03/2019 12:00 AM Negative  Negative     Hepatitis B Lab Results  Component Value Date   HEPBSAB NON-REACTIVE 08/26/2022   HEPBSAG NEG 02/12/2010    HEPBCAB NEG 02/12/2010   Hepatitis C No results found for: HEPCAB, HCVRNAPCRQN Hepatitis A Lab Results  Component Value Date   HAV REACTIVE (A) 08/26/2022   Lipids: Lab Results  Component Value Date   CHOL 220 (H) 11/02/2023   TRIG 100 11/02/2023   HDL 41 11/02/2023   CHOLHDL 5.4 (H) 11/02/2023   VLDL 38 (H) 06/25/2015   LDLCALC 158 (H) 11/02/2023    TARGET DATE: The 13th  Assessment: Unnamed presents today for his maintenance Cabenuva  injections. Past injections were associated with some soreness, fever and body aches, but resolve after a few days. Counseled on using Advil  and ice packs to relieve the symptoms. Doing well with no issues today. Last HIV RNA was undetectable in July. Will collect in January to monitor every 6 months. He requests STI testing today.   Administered cabotegravir  600mg /67mL in left upper outer quadrant of the gluteal muscle. Administered rilpivirine  900 mg/3mL in the right upper outer quadrant of the gluteal muscle. No issues with injections. He will follow up in 2 months for next set of injections.  Eduardo Leon is eligible for the 2025-2026 flu vaccine and the second hepatitis B vaccine today. He declines the flu vaccine, but accepts the hep B vaccine. He is now complete with his hep B series.   Plan: - Cabenuva  injections administered - Collected RPR, oral, rectal, and urine cytologies for GC/chlamydia today - Administered the hepatitis B vaccine in the right deltoid - Next injections scheduled for 03/08/24 with Alan - Call with any issues or questions  Izetta Carl, PharmD PGY1 Pharmacy Resident [facility]  12/28/2023 6:39 PM

## 2023-12-29 ENCOUNTER — Ambulatory Visit: Payer: Self-pay | Admitting: Pharmacist

## 2023-12-29 ENCOUNTER — Other Ambulatory Visit (HOSPITAL_COMMUNITY)
Admission: RE | Admit: 2023-12-29 | Discharge: 2023-12-29 | Disposition: A | Discharge status: Home / Self Care | Source: Ambulatory Visit | Attending: Infectious Diseases | Admitting: Infectious Diseases

## 2023-12-29 ENCOUNTER — Other Ambulatory Visit: Payer: Self-pay

## 2023-12-29 DIAGNOSIS — B20 Human immunodeficiency virus [HIV] disease: Secondary | ICD-10-CM

## 2023-12-29 DIAGNOSIS — Z23 Encounter for immunization: Secondary | ICD-10-CM | POA: Diagnosis not present

## 2023-12-29 DIAGNOSIS — Z113 Encounter for screening for infections with a predominantly sexual mode of transmission: Secondary | ICD-10-CM | POA: Insufficient documentation

## 2023-12-29 MED ORDER — CABOTEGRAVIR & RILPIVIRINE ER 600 & 900 MG/3ML IM SUER
1.0000 | Freq: Once | INTRAMUSCULAR | Status: AC
  Administered 2023-12-29: 1 via INTRAMUSCULAR

## 2023-12-30 LAB — CYTOLOGY, (ORAL, ANAL, URETHRAL) ANCILLARY ONLY
Chlamydia: NEGATIVE
Chlamydia: NEGATIVE
Comment: NEGATIVE
Comment: NEGATIVE
Comment: NORMAL
Comment: NORMAL
Neisseria Gonorrhea: NEGATIVE
Neisseria Gonorrhea: NEGATIVE

## 2023-12-30 LAB — URINE CYTOLOGY ANCILLARY ONLY
Chlamydia: NEGATIVE
Comment: NEGATIVE
Comment: NORMAL
Neisseria Gonorrhea: NEGATIVE

## 2023-12-31 LAB — RPR: RPR Ser Ql: NONREACTIVE

## 2024-02-04 ENCOUNTER — Other Ambulatory Visit (HOSPITAL_BASED_OUTPATIENT_CLINIC_OR_DEPARTMENT_OTHER): Payer: Self-pay

## 2024-02-04 ENCOUNTER — Other Ambulatory Visit: Payer: Self-pay

## 2024-02-04 MED ORDER — OZEMPIC (1 MG/DOSE) 4 MG/3ML ~~LOC~~ SOPN
1.0000 mg | PEN_INJECTOR | SUBCUTANEOUS | 0 refills | Status: AC
  Filled 2024-02-04 (×2): qty 3, 28d supply, fill #0

## 2024-02-04 MED ORDER — CLOBETASOL PROPIONATE 0.05 % EX OINT: TOPICAL_OINTMENT | Freq: Two times a day (BID) | CUTANEOUS | 6 refills | Status: AC

## 2024-02-04 MED ORDER — ATORVASTATIN CALCIUM 20 MG PO TABS: 20.0000 mg | ORAL_TABLET | Freq: Every day | ORAL | 3 refills | Status: AC

## 2024-02-04 MED ORDER — AMITRIPTYLINE HCL 25 MG PO TABS
25.0000 mg | ORAL_TABLET | ORAL | 0 refills | Status: AC | PRN
  Filled 2024-02-04 (×2): qty 10, 10d supply, fill #0

## 2024-02-04 MED ORDER — UBRELVY 100 MG PO TABS
100.0000 mg | ORAL_TABLET | Freq: Every day | ORAL | 0 refills | Status: AC
  Filled 2024-02-04: qty 30, 30d supply, fill #0

## 2024-02-04 MED ORDER — ATORVASTATIN CALCIUM 20 MG PO TABS
20.0000 mg | ORAL_TABLET | Freq: Every evening | ORAL | 3 refills | Status: AC
  Filled 2024-02-04 (×2): qty 90, 90d supply, fill #0

## 2024-02-04 MED ORDER — TOPIRAMATE 50 MG PO TABS
50.0000 mg | ORAL_TABLET | Freq: Every day | ORAL | 0 refills | Status: AC
  Filled 2024-02-04 (×2): qty 30, 30d supply, fill #0

## 2024-02-04 MED ORDER — OZEMPIC (0.25 OR 0.5 MG/DOSE) 2 MG/3ML ~~LOC~~ SOPN: 0.5000 mg | PEN_INJECTOR | SUBCUTANEOUS | 0 refills | Status: AC

## 2024-02-05 ENCOUNTER — Other Ambulatory Visit (HOSPITAL_BASED_OUTPATIENT_CLINIC_OR_DEPARTMENT_OTHER): Payer: Self-pay

## 2024-02-09 ENCOUNTER — Other Ambulatory Visit (HOSPITAL_BASED_OUTPATIENT_CLINIC_OR_DEPARTMENT_OTHER): Payer: Self-pay

## 2024-02-10 ENCOUNTER — Other Ambulatory Visit: Payer: Self-pay

## 2024-02-10 ENCOUNTER — Other Ambulatory Visit (HOSPITAL_COMMUNITY): Payer: Self-pay

## 2024-02-10 NOTE — Progress Notes (Signed)
 Specialty Pharmacy Refill Coordination Note  Eduardo Leon is a 38 y.o. male assessed today regarding refills of clinic administered specialty medication(s) Cabotegravir  & Rilpivirine  (CABENUVA )   Clinic requested Courier to Provider Office   Delivery date: 02/22/24   Verified address: [address] [place]  [address]   Medication will be filled on 02/19/24.

## 2024-02-11 ENCOUNTER — Other Ambulatory Visit (HOSPITAL_BASED_OUTPATIENT_CLINIC_OR_DEPARTMENT_OTHER): Payer: Self-pay

## 2024-02-11 ENCOUNTER — Other Ambulatory Visit: Payer: Self-pay

## 2024-02-12 ENCOUNTER — Other Ambulatory Visit (HOSPITAL_BASED_OUTPATIENT_CLINIC_OR_DEPARTMENT_OTHER): Payer: Self-pay

## 2024-02-15 ENCOUNTER — Other Ambulatory Visit (HOSPITAL_BASED_OUTPATIENT_CLINIC_OR_DEPARTMENT_OTHER): Payer: Self-pay

## 2024-02-16 ENCOUNTER — Other Ambulatory Visit (HOSPITAL_COMMUNITY): Payer: Self-pay

## 2024-02-16 ENCOUNTER — Other Ambulatory Visit (HOSPITAL_BASED_OUTPATIENT_CLINIC_OR_DEPARTMENT_OTHER): Payer: Self-pay

## 2024-02-17 ENCOUNTER — Other Ambulatory Visit (HOSPITAL_BASED_OUTPATIENT_CLINIC_OR_DEPARTMENT_OTHER): Payer: Self-pay

## 2024-02-18 ENCOUNTER — Other Ambulatory Visit (HOSPITAL_BASED_OUTPATIENT_CLINIC_OR_DEPARTMENT_OTHER): Payer: Self-pay

## 2024-02-19 ENCOUNTER — Other Ambulatory Visit (HOSPITAL_BASED_OUTPATIENT_CLINIC_OR_DEPARTMENT_OTHER): Payer: Self-pay

## 2024-02-19 ENCOUNTER — Other Ambulatory Visit: Payer: Self-pay

## 2024-02-22 ENCOUNTER — Other Ambulatory Visit (HOSPITAL_BASED_OUTPATIENT_CLINIC_OR_DEPARTMENT_OTHER): Payer: Self-pay

## 2024-02-22 ENCOUNTER — Telehealth: Payer: Self-pay

## 2024-02-22 NOTE — Telephone Encounter (Signed)
 [facility] Patient Advocate Encounter  Patient's medications Cabenuva  have been couriered to [facility] from [facility] and will be administered at the patients appointment on 03/08/24.  Arland Hutchinson, CPhT Specialty Pharmacy Patient [facility] for Infectious Disease Phone: [phone] Fax:  [phone]

## 2024-02-23 ENCOUNTER — Other Ambulatory Visit (HOSPITAL_BASED_OUTPATIENT_CLINIC_OR_DEPARTMENT_OTHER): Payer: Self-pay

## 2024-02-24 ENCOUNTER — Other Ambulatory Visit (HOSPITAL_BASED_OUTPATIENT_CLINIC_OR_DEPARTMENT_OTHER): Payer: Self-pay

## 2024-02-29 ENCOUNTER — Other Ambulatory Visit (HOSPITAL_COMMUNITY): Payer: Self-pay

## 2024-02-29 ENCOUNTER — Encounter: Admitting: Pharmacist

## 2024-03-07 NOTE — Progress Notes (Unsigned)
 HPI: Eduardo Leon is a 38 y.o. male who presents to the [facility] pharmacy clinic for Cabenuva  administration.  Referring ID Provider: Dr. Dea   Patient Active Problem List   Diagnosis Date Noted   HIV disease (HCC) 11/04/2023   Medication management 11/04/2023   Health care maintenance 11/04/2023   Screening for STDs (sexually transmitted diseases) 11/04/2023   Need for hepatitis B vaccination 11/04/2023   Hyperglycemia 03/14/2020   Need for prophylactic vaccination and inoculation against influenza 03/21/2019   Medication monitoring encounter 01/25/2018   Screening examination for venereal disease 06/25/2015   Encounter for long-term (current) use of medications 06/25/2015   Syphilis in male 01/18/2014   Hemorrhoids, internal, with bleeding 08/02/2013   Depression    Human immunodeficiency virus (HIV) disease (HCC) 02/12/2010    Patient's Medications  New Prescriptions   No medications on file  Previous Medications   AMITRIPTYLINE (ELAVIL) 25 MG TABLET    Take 1 tablet (25 mg total) by mouth as needed for severe migraine.   ATORVASTATIN (LIPITOR) 20 MG TABLET    Take 1 tablet (20 mg total) by mouth at bedtime for high cholesterol.   ATORVASTATIN (LIPITOR) 20 MG TABLET    Take 1 tablet (20 mg total) by mouth at bedtime.   CABOTEGRAVIR  & RILPIVIRINE  ER (CABENUVA ) 600 & 900 MG/3ML INJECTION    Inject 1 kit into the muscle every 2 (two) months.   CLOBETASOL OINTMENT (TEMOVATE) 0.05 %    Apply topically 2 (two) times daily to rash on hands and feet as needed.   EPINEPHRINE  (EPIPEN  2-PAK) 0.3 MG/0.3 ML IJ SOAJ INJECTION    Inject 0.3 mg into the muscle as needed for anaphylaxis.   IBUPROFEN  (ADVIL ) 800 MG TABLET    Take 1 tablet (800 mg total) by mouth every 8 (eight) hours as needed.   SEMAGLUTIDE, 1 MG/DOSE, (OZEMPIC, 1 MG/DOSE,) 4 MG/3ML SOPN    Inject 1 mg into the skin once a week.   SEMAGLUTIDE,0.25 OR 0.5MG /DOS, (OZEMPIC, 0.25 OR 0.5 MG/DOSE,) 2 MG/3ML SOPN    Inject 0.5 mg  into the skin once a week.   TOPIRAMATE (TOPAMAX) 50 MG TABLET    Take 1 tablet (50 mg total) by mouth at bedtime to prevent migraines.   UBROGEPANT (UBRELVY) 100 MG TABS    Take 1 tablet (100 mg total) by mouth daily for migraine; may repeat dose once after 2 hours  Modified Medications   No medications on file  Discontinued Medications   No medications on file    Allergies: Allergies  Allergen Reactions   Shellfish Protein-Containing Drug Products Anaphylaxis, Hives and Itching   Codeine Hives   Hydrocodone      Severe itching and rash on legs and back. 2mg  morphine  charted in 2011 - reaction unknown   Vancomycin  Hives    Labs: Lab Results  Component Value Date   HIV1RNAQUANT NOT DETECTED 11/02/2023   HIV1RNAQUANT NOT DETECTED 07/09/2023   HIV1RNAQUANT Not Detected 12/30/2022   CD4TABS 761 11/02/2023   CD4TABS 796 12/30/2022   CD4TABS 643 07/01/2022    RPR and STI Lab Results  Component Value Date   LABRPR NON-REACTIVE 12/29/2023   LABRPR NON-REACTIVE 11/02/2023   LABRPR NON-REACTIVE 08/26/2023   LABRPR NON-REACTIVE 07/09/2023   LABRPR NON-REACTIVE 05/26/2023   RPRTITER 1:1 (H) 03/18/2021   RPRTITER 1:1 (H) 02/28/2020   RPRTITER 1:1 (H) 03/03/2019   RPRTITER 1:2 (H) 01/04/2018   RPRTITER 1:64 (A) 10/28/2016    STI Results GC CT  12/29/2023 10:21 AM Negative    Negative    Negative  Negative    Negative    Negative   11/02/2023 11:00 AM Negative    Negative    Negative  Negative    Negative    Negative   08/26/2023 10:34 AM Negative  Negative   08/26/2023 10:32 AM Negative    Negative  Negative    Negative   07/09/2023  9:56 AM Negative    Negative    Negative  Negative    Negative    Negative   05/26/2023  1:51 PM Negative    Negative    Negative  Negative    Negative    Negative   02/25/2023 11:01 AM Negative    Negative    Negative  Negative    Negative    Negative   12/30/2022 10:54 AM Negative    Negative    Negative  Negative    Negative     Negative   10/29/2022 11:11 AM Negative    Negative    Negative  Negative    Negative    Negative   08/26/2022 11:38 AM Negative    Negative    Negative  Negative    Negative    Negative   07/01/2022 10:06 AM Negative    Negative    Negative  Negative    Negative    Negative   04/30/2022 10:41 AM Negative    Negative    Negative  Negative    Negative    Negative   03/18/2022 10:23 AM Negative    Negative    Positive  Negative    Negative    Negative   03/18/2021  4:20 PM Negative    Negative    Positive  Negative    Negative    Negative   02/18/2021  5:44 PM Negative  Negative   06/05/2020  7:45 PM Negative  Positive   03/14/2020 10:54 AM Negative    Negative  Negative    Negative   02/28/2020 10:45 AM Negative  Negative   01/31/2020  7:01 PM Negative  Negative   03/21/2019  9:33 AM Negative    Negative  Negative    Negative     Hepatitis B Lab Results  Component Value Date   HEPBSAB NON-REACTIVE 08/26/2022   HEPBSAG NEG 02/12/2010   HEPBCAB NEG 02/12/2010   Hepatitis C No results found for: HEPCAB, HCVRNAPCRQN Hepatitis A Lab Results  Component Value Date   HAV REACTIVE (A) 08/26/2022   Lipids: Lab Results  Component Value Date   CHOL 220 (H) 11/02/2023   TRIG 100 11/02/2023   HDL 41 11/02/2023   CHOLHDL 5.4 (H) 11/02/2023   VLDL 38 (H) 06/25/2015   LDLCALC 158 (H) 11/02/2023    Target Date: 13th   Assessment: Eduardo Leon presents today for his maintenance Cabenuva  injections. Past injections were tolerated well without issues. Last HIV RNA was undetectable on 11/02/2023. Doing well with no issues today.  Lab work:  oral/urine/rectal cytologies for GC/Chlamydia, RPR ***, hepatitis B antibody   Eligible vaccinations: Influenza, Covid, Shingrix 1/2  ***  Cabenuva : Administered cabotegravir  600mg /39mL in left upper outer quadrant of the gluteal muscle. Administered rilpivirine  900 mg/3mL in the right upper outer quadrant of the  gluteal muscle. No issues with injections. Eduardo Leon will follow up in 2 months for next set of injections.  Plan: - Cabenuva  injections administered - Next injections scheduled for ***1/6-1/20, then 3/6-3/20  - Call with any  issues or questions  Eduardo Leon, PharmD PGY2 Infectious Diseases Pharmacy Resident

## 2024-03-08 ENCOUNTER — Ambulatory Visit (INDEPENDENT_AMBULATORY_CARE_PROVIDER_SITE_OTHER): Admitting: Pharmacist

## 2024-03-08 ENCOUNTER — Other Ambulatory Visit: Payer: Self-pay

## 2024-03-08 DIAGNOSIS — B20 Human immunodeficiency virus [HIV] disease: Secondary | ICD-10-CM

## 2024-03-08 DIAGNOSIS — Z0000 Encounter for general adult medical examination without abnormal findings: Secondary | ICD-10-CM

## 2024-03-08 DIAGNOSIS — Z113 Encounter for screening for infections with a predominantly sexual mode of transmission: Secondary | ICD-10-CM

## 2024-03-08 MED ORDER — CABOTEGRAVIR & RILPIVIRINE ER 600 & 900 MG/3ML IM SUER
1.0000 | Freq: Once | INTRAMUSCULAR | Status: AC
  Administered 2024-03-08: 1 via INTRAMUSCULAR

## 2024-03-09 LAB — CT/NG RNA, TMA RECTAL
Chlamydia Trachomatis RNA: NOT DETECTED
Neisseria Gonorrhoeae RNA: NOT DETECTED

## 2024-03-09 LAB — GC/CHLAMYDIA PROBE, AMP (THROAT)
Chlamydia trachomatis RNA: NOT DETECTED
Neisseria gonorrhoeae RNA: NOT DETECTED

## 2024-03-09 LAB — HEPATITIS B SURFACE ANTIBODY,QUALITATIVE: Hep B S Ab: REACTIVE — AB

## 2024-03-09 LAB — RPR: RPR Ser Ql: NONREACTIVE

## 2024-03-09 LAB — C. TRACHOMATIS/N. GONORRHOEAE RNA
C. trachomatis RNA, TMA: NOT DETECTED
N. gonorrhoeae RNA, TMA: NOT DETECTED

## 2024-03-30 ENCOUNTER — Other Ambulatory Visit: Payer: Self-pay

## 2024-03-30 ENCOUNTER — Other Ambulatory Visit (HOSPITAL_COMMUNITY): Payer: Self-pay

## 2024-03-30 NOTE — Progress Notes (Signed)
 Specialty Pharmacy Refill Coordination Note  Eduardo Leon is a 38 y.o. male assessed today regarding refills of clinic administered specialty medication(s) Cabotegravir  & Rilpivirine  (CABENUVA )   Clinic requested Courier to Provider Office   Delivery date: 04/18/24   Verified address: [address] [place] [address]   Medication will be filled on 04/15/24.

## 2024-04-15 ENCOUNTER — Other Ambulatory Visit: Payer: Self-pay

## 2024-04-18 ENCOUNTER — Telehealth: Payer: Self-pay

## 2024-04-18 NOTE — Telephone Encounter (Signed)
 [facility] Patient Advocate Encounter  Patient's medications CABENUVA  have been couriered to [facility] from [facility] and will be administered at the patients appointment on 04/28/24.  Charmaine Sharps, CPhT Specialty Pharmacy Patient [facility] for Infectious Disease Phone: [phone] Fax:  [phone]

## 2024-04-28 ENCOUNTER — Ambulatory Visit: Admitting: Infectious Diseases

## 2024-04-28 ENCOUNTER — Encounter: Admitting: Infectious Diseases

## 2024-05-04 ENCOUNTER — Other Ambulatory Visit (HOSPITAL_COMMUNITY)
Admission: RE | Admit: 2024-05-04 | Discharge: 2024-05-04 | Disposition: A | Discharge status: Home / Self Care | Source: Ambulatory Visit | Attending: Infectious Disease | Admitting: Infectious Disease

## 2024-05-04 ENCOUNTER — Other Ambulatory Visit: Payer: Self-pay

## 2024-05-04 ENCOUNTER — Ambulatory Visit: Admitting: Pharmacist

## 2024-05-04 DIAGNOSIS — Z113 Encounter for screening for infections with a predominantly sexual mode of transmission: Secondary | ICD-10-CM | POA: Diagnosis present

## 2024-05-04 DIAGNOSIS — B20 Human immunodeficiency virus [HIV] disease: Secondary | ICD-10-CM

## 2024-05-04 MED ORDER — CABOTEGRAVIR & RILPIVIRINE ER 600 & 900 MG/3ML IM SUER
1.0000 | Freq: Once | INTRAMUSCULAR | Status: AC
  Administered 2024-05-04: 1 via INTRAMUSCULAR
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
## 2024-05-05 LAB — CYTOLOGY, (ORAL, ANAL, URETHRAL) ANCILLARY ONLY
Chlamydia: NEGATIVE
Chlamydia: NEGATIVE
Comment: NEGATIVE
Comment: NEGATIVE
Comment: NORMAL
Comment: NORMAL
Neisseria Gonorrhea: NEGATIVE
Neisseria Gonorrhea: NEGATIVE

## 2024-05-05 LAB — URINE CYTOLOGY ANCILLARY ONLY
Chlamydia: NEGATIVE
Comment: NEGATIVE
Comment: NORMAL
Neisseria Gonorrhea: NEGATIVE

## 2024-05-06 LAB — HIV-1 RNA QUANT-NO REFLEX-BLD
HIV 1 RNA Quant: NOT DETECTED {copies}/mL
HIV-1 RNA Quant, Log: NOT DETECTED {Log_copies}/mL

## 2024-05-06 LAB — SYPHILIS: RPR W/REFLEX TO RPR TITER AND TREPONEMAL ANTIBODIES, TRADITIONAL SCREENING AND DIAGNOSIS ALGORITHM: RPR Ser Ql: NONREACTIVE

## 2024-06-24 ENCOUNTER — Encounter: Admitting: Pharmacist

## 2024-06-28 ENCOUNTER — Encounter: Payer: Self-pay | Admitting: Infectious Diseases

## 2024-08-25 ENCOUNTER — Encounter: Admitting: Pharmacist
